# Patient Record
Sex: Male | Born: 1955 | Race: White | Hispanic: No | Marital: Single | Smoking: Never smoker
Health system: Southern US, Community
[De-identification: ages and names within clinical notes are randomized; demographics above are authoritative.]

## PROBLEM LIST (undated history)

## (undated) DIAGNOSIS — R079 Chest pain, unspecified: Secondary | ICD-10-CM

## (undated) DIAGNOSIS — I214 Non-ST elevation (NSTEMI) myocardial infarction: Secondary | ICD-10-CM

## (undated) DIAGNOSIS — Z951 Presence of aortocoronary bypass graft: Secondary | ICD-10-CM

## (undated) DIAGNOSIS — E669 Obesity, unspecified: Secondary | ICD-10-CM

## (undated) DIAGNOSIS — F419 Anxiety disorder, unspecified: Secondary | ICD-10-CM

## (undated) DIAGNOSIS — I2510 Atherosclerotic heart disease of native coronary artery without angina pectoris: Secondary | ICD-10-CM

## (undated) HISTORY — DX: Obesity, unspecified: E66.9

## (undated) HISTORY — DX: Atherosclerotic heart disease of native coronary artery without angina pectoris: I25.10

## (undated) HISTORY — DX: Presence of aortocoronary bypass graft: Z95.1

## (undated) HISTORY — DX: Non-ST elevation (NSTEMI) myocardial infarction: I21.4

## (undated) HISTORY — DX: Chest pain, unspecified: R07.9

---

## 2009-08-21 ENCOUNTER — Emergency Department (HOSPITAL_BASED_OUTPATIENT_CLINIC_OR_DEPARTMENT_OTHER): Admission: EM | Admit: 2009-08-21 | Discharge: 2009-08-22 | Payer: Self-pay | Admitting: Emergency Medicine

## 2009-08-31 ENCOUNTER — Encounter: Admission: RE | Admit: 2009-08-31 | Discharge: 2009-09-29 | Payer: Self-pay | Admitting: Orthopedic Surgery

## 2012-06-23 ENCOUNTER — Encounter (HOSPITAL_BASED_OUTPATIENT_CLINIC_OR_DEPARTMENT_OTHER): Payer: Self-pay | Admitting: *Deleted

## 2012-06-23 ENCOUNTER — Emergency Department (HOSPITAL_BASED_OUTPATIENT_CLINIC_OR_DEPARTMENT_OTHER)
Admission: EM | Admit: 2012-06-23 | Discharge: 2012-06-23 | Disposition: A | Discharge status: Home / Self Care | Payer: BC Managed Care – PPO | Attending: Emergency Medicine | Admitting: Emergency Medicine

## 2012-06-23 DIAGNOSIS — S51809A Unspecified open wound of unspecified forearm, initial encounter: Secondary | ICD-10-CM | POA: Insufficient documentation

## 2012-06-23 DIAGNOSIS — Y92009 Unspecified place in unspecified non-institutional (private) residence as the place of occurrence of the external cause: Secondary | ICD-10-CM | POA: Insufficient documentation

## 2012-06-23 DIAGNOSIS — Y93H2 Activity, gardening and landscaping: Secondary | ICD-10-CM | POA: Insufficient documentation

## 2012-06-23 DIAGNOSIS — IMO0002 Reserved for concepts with insufficient information to code with codable children: Secondary | ICD-10-CM

## 2012-06-23 DIAGNOSIS — W298XXA Contact with other powered powered hand tools and household machinery, initial encounter: Secondary | ICD-10-CM | POA: Insufficient documentation

## 2012-06-23 HISTORY — DX: Anxiety disorder, unspecified: F41.9

## 2012-06-23 MED ORDER — TETANUS-DIPHTH-ACELL PERTUSSIS 5-2.5-18.5 LF-MCG/0.5 IM SUSP: 0.5000 mL | Freq: Once | INTRAMUSCULAR | Status: DC

## 2012-06-23 NOTE — ED Provider Notes (Signed)
History     CSN: [id number]  Arrival date & time 06/23/12  1238   First MD Initiated Contact with Patient 06/23/12 1244      No chief complaint on file.   (Consider location/radiation/quality/duration/timing/severity/associated sxs/prior treatment) HPI Comments: 57 year old male presents emergency department after sustaining a laceration to his right forearm with a power operated saw while trimming his bushes in the yard prior to arrival. Patient states initially the wound was bleeding, however was able to control it. Denies pain initially or at this time. Last TDAP 2013. He does not take any blood thinners.  The history is provided by the patient.    No past medical history on file.  No past surgical history on file.  No family history on file.  History  Substance Use Topics  . Smoking status: Not on file  . Smokeless tobacco: Not on file  . Alcohol Use: Not on file      Review of Systems  Skin: Positive for wound.  Neurological: Negative for dizziness and light-headedness.  All other systems reviewed and are negative.    Allergies  Review of patient's allergies indicates not on file.  Home Medications  No current outpatient prescriptions on file.  There were no vitals taken for this visit.  Physical Exam  Nursing note and vitals reviewed. Constitutional: He is oriented to person, place, and time. He appears well-developed and well-nourished. No distress.  HENT:  Head: Normocephalic and atraumatic.  Eyes: Conjunctivae and EOM are normal.  Neck: Normal range of motion. Neck supple.  Cardiovascular: Normal rate, regular rhythm and normal heart sounds.   Pulmonary/Chest: Effort normal and breath sounds normal.  Musculoskeletal: Normal range of motion. He exhibits no edema.  Full range of motion of right arm. Strength 5 out of 5 upper extremity equal bilateral. Full use of forearm flexor and extensor muscles. No evidence of tendinous disruption.  Neurological:  He is alert and oriented to person, place, and time. He has normal strength. No sensory deficit.  Skin: Skin is warm and dry.     1.5 inch jagged laceration to right forearm. Wound partially through subcutaneous fat, not completely to muscle. Capillary refill less than 3 seconds.  Psychiatric: He has a normal mood and affect. His behavior is normal.    ED Course  Procedures (including critical care time) LACERATION REPAIR Performed by: Johnnette Gourd Authorized by: Johnnette Gourd Consent: Verbal consent obtained. Risks and benefits: risks, benefits and alternatives were discussed Consent given by: patient Patient identity confirmed: provided demographic data Prepped and Draped in normal sterile fashion Wound explored  Laceration Location: right forearm  Laceration Length: 3 cm  No Foreign Bodies seen or palpated  Anesthesia: local infiltration  Local anesthetic: lidocaine 2% without epinephrine  Anesthetic total: 4 ml  Irrigation method: syringe Amount of cleaning: standard  Skin closure: 4-0 ethilon  Number of sutures: 12  Technique: simple interrupted  Patient tolerance: Patient tolerated the procedure well with no immediate complications.  Labs Reviewed - No data to display No results found.   1. Laceration       MDM  57 year old male with laceration to right forearm. No evidence of tendinous disruption. Neurovascularly intact. Bleeding controlled. Wound clean and in sutured with 12 sutures placed. Last tetanus shot 2013. He is in no apparent distress. Wound care given. Patient stable for discharge. Return precautions discussed. Patient states understanding of plan and is agreeable.        Trevor Mace, PA-C 06/23/12 1346

## 2012-06-23 NOTE — ED Notes (Signed)
Patient states he was using a gas powdered hedge trimmer and accidentally cut his right forearm.  Neurovascular warm to touch, arm strength is normal, cap refill <3 seconds,  Bleeding controlled.

## 2012-06-25 NOTE — ED Provider Notes (Signed)
Medical screening examination/treatment/procedure(s) were performed by non-physician practitioner and as supervising physician I was immediately available for consultation/collaboration.  Harve Spradley T Maccoy Haubner, MD 06/25/12 0828 

## 2016-06-09 ENCOUNTER — Inpatient Hospital Stay (HOSPITAL_COMMUNITY)
Admission: AD | Admit: 2016-06-09 | Discharge: 2016-06-20 | DRG: 234 | Disposition: A | Discharge status: Home / Self Care | Payer: Non-veteran care | Source: Other Acute Inpatient Hospital | Attending: Cardiothoracic Surgery | Admitting: Cardiothoracic Surgery

## 2016-06-09 ENCOUNTER — Encounter (HOSPITAL_COMMUNITY): Payer: Self-pay

## 2016-06-09 ENCOUNTER — Inpatient Hospital Stay (HOSPITAL_COMMUNITY): Payer: Non-veteran care

## 2016-06-09 DIAGNOSIS — I2510 Atherosclerotic heart disease of native coronary artery without angina pectoris: Secondary | ICD-10-CM | POA: Diagnosis present

## 2016-06-09 DIAGNOSIS — R079 Chest pain, unspecified: Secondary | ICD-10-CM

## 2016-06-09 DIAGNOSIS — E785 Hyperlipidemia, unspecified: Secondary | ICD-10-CM | POA: Diagnosis present

## 2016-06-09 DIAGNOSIS — I252 Old myocardial infarction: Secondary | ICD-10-CM | POA: Diagnosis not present

## 2016-06-09 DIAGNOSIS — I25110 Atherosclerotic heart disease of native coronary artery with unstable angina pectoris: Secondary | ICD-10-CM | POA: Diagnosis not present

## 2016-06-09 DIAGNOSIS — I454 Nonspecific intraventricular block: Secondary | ICD-10-CM | POA: Diagnosis present

## 2016-06-09 DIAGNOSIS — E119 Type 2 diabetes mellitus without complications: Secondary | ICD-10-CM | POA: Diagnosis present

## 2016-06-09 DIAGNOSIS — D62 Acute posthemorrhagic anemia: Secondary | ICD-10-CM | POA: Diagnosis not present

## 2016-06-09 DIAGNOSIS — I200 Unstable angina: Secondary | ICD-10-CM | POA: Diagnosis not present

## 2016-06-09 DIAGNOSIS — Z7982 Long term (current) use of aspirin: Secondary | ICD-10-CM

## 2016-06-09 DIAGNOSIS — F419 Anxiety disorder, unspecified: Secondary | ICD-10-CM | POA: Diagnosis present

## 2016-06-09 DIAGNOSIS — I209 Angina pectoris, unspecified: Secondary | ICD-10-CM | POA: Diagnosis not present

## 2016-06-09 DIAGNOSIS — I5022 Chronic systolic (congestive) heart failure: Secondary | ICD-10-CM | POA: Diagnosis present

## 2016-06-09 DIAGNOSIS — I214 Non-ST elevation (NSTEMI) myocardial infarction: Principal | ICD-10-CM

## 2016-06-09 DIAGNOSIS — Z79899 Other long term (current) drug therapy: Secondary | ICD-10-CM | POA: Diagnosis not present

## 2016-06-09 DIAGNOSIS — Z6835 Body mass index (BMI) 35.0-35.9, adult: Secondary | ICD-10-CM | POA: Diagnosis not present

## 2016-06-09 DIAGNOSIS — Z951 Presence of aortocoronary bypass graft: Secondary | ICD-10-CM

## 2016-06-09 DIAGNOSIS — J9811 Atelectasis: Secondary | ICD-10-CM | POA: Diagnosis not present

## 2016-06-09 DIAGNOSIS — E669 Obesity, unspecified: Secondary | ICD-10-CM | POA: Diagnosis present

## 2016-06-09 DIAGNOSIS — Z09 Encounter for follow-up examination after completed treatment for conditions other than malignant neoplasm: Secondary | ICD-10-CM

## 2016-06-09 DIAGNOSIS — Z01818 Encounter for other preprocedural examination: Secondary | ICD-10-CM

## 2016-06-09 DIAGNOSIS — Z01810 Encounter for preprocedural cardiovascular examination: Secondary | ICD-10-CM | POA: Diagnosis not present

## 2016-06-09 DIAGNOSIS — Z9689 Presence of other specified functional implants: Secondary | ICD-10-CM

## 2016-06-09 HISTORY — DX: Obesity, unspecified: E66.9

## 2016-06-09 HISTORY — DX: Non-ST elevation (NSTEMI) myocardial infarction: I21.4

## 2016-06-09 HISTORY — DX: Chest pain, unspecified: R07.9

## 2016-06-09 LAB — ECHOCARDIOGRAM COMPLETE
AOASC: 31 cm
E decel time: 208 msec
EERAT: 12.44
FS: 34 % (ref 28–44)
HEIGHTINCHES: 75 in
IVS/LV PW RATIO, ED: 1
LA diam end sys: 40 mm
LA diam index: 1.56 cm/m2
LA vol A4C: 39.6 ml
LASIZE: 40 mm
LV e' LATERAL: 6.2 cm/s
LVEEAVG: 12.44
LVEEMED: 12.44
LVOT area: 4.15 cm2
LVOT diameter: 23 mm
Lateral S' vel: 20.2 cm/s
MV Dec: 208
MVAP: 3.61 cm2
MVPG: 2 mmHg
MVPKEVEL: 77.1 m/s
MVSPHT: 61 ms
PW: 12 mm — AB (ref 0.6–1.1)
RV TAPSE: 21.4 mm
TDI e' lateral: 6.2
TDI e' medial: 8.49
WEIGHTICAEL: 4638.48 [oz_av]

## 2016-06-09 LAB — LIPID PANEL
CHOL/HDL RATIO: 5.4 ratio
CHOLESTEROL: 222 mg/dL — AB (ref 0–200)
HDL: 41 mg/dL (ref >40)
LDL Cholesterol: 126 mg/dL — ABNORMAL HIGH (ref 0–99)
TRIGLYCERIDES: 273 mg/dL — AB (ref <150)
VLDL: 55 mg/dL — AB (ref 0–40)

## 2016-06-09 LAB — COMPREHENSIVE METABOLIC PANEL
ALBUMIN: 3.5 g/dL (ref 3.5–5.0)
ALT: 17 U/L (ref 17–63)
ANION GAP: 9 (ref 5–15)
AST: 32 U/L (ref 15–41)
Alkaline Phosphatase: 46 U/L (ref 38–126)
BUN: 17 mg/dL (ref 6–20)
CHLORIDE: 106 mmol/L (ref 101–111)
CO2: 24 mmol/L (ref 22–32)
Calcium: 8.6 mg/dL — ABNORMAL LOW (ref 8.9–10.3)
Creatinine, Ser: 0.95 mg/dL (ref 0.61–1.24)
GFR calc non Af Amer: >60 mL/min (ref >60)
GLUCOSE: 120 mg/dL — AB (ref 65–99)
Potassium: 4 mmol/L (ref 3.5–5.1)
SODIUM: 139 mmol/L (ref 135–145)
Total Bilirubin: 0.5 mg/dL (ref 0.3–1.2)
Total Protein: 6.5 g/dL (ref 6.5–8.1)

## 2016-06-09 LAB — APTT: aPTT: 65 seconds — ABNORMAL HIGH (ref 24–36)

## 2016-06-09 LAB — PROTIME-INR
INR: 1.04
Prothrombin Time: 13.7 seconds (ref 11.4–15.2)

## 2016-06-09 LAB — CBC
HCT: 39.7 % (ref 39.0–52.0)
Hemoglobin: 12.8 g/dL — ABNORMAL LOW (ref 13.0–17.0)
MCH: 30.3 pg (ref 26.0–34.0)
MCHC: 32.2 g/dL (ref 30.0–36.0)
MCV: 93.9 fL (ref 78.0–100.0)
PLATELETS: 196 10*3/uL (ref 150–400)
RBC: 4.23 MIL/uL (ref 4.22–5.81)
RDW: 13.6 % (ref 11.5–15.5)
WBC: 8.9 10*3/uL (ref 4.0–10.5)

## 2016-06-09 LAB — TROPONIN I
Troponin I: 2.65 ng/mL (ref <0.03)
Troponin I: 2.07 ng/mL (ref <0.03)
Troponin I: 3.03 ng/mL (ref <0.03)
Troponin I: 2.42 ng/mL (ref <0.03)

## 2016-06-09 LAB — BRAIN NATRIURETIC PEPTIDE: B Natriuretic Peptide: 81.3 pg/mL (ref 0.0–100.0)

## 2016-06-09 LAB — HEPARIN LEVEL (UNFRACTIONATED)
Heparin Unfractionated: 0.33 IU/mL (ref 0.30–0.70)
Heparin Unfractionated: 0.38 IU/mL (ref 0.30–0.70)

## 2016-06-09 MED ORDER — TICAGRELOR 90 MG PO TABS
180.0000 mg | ORAL_TABLET | Freq: Once | ORAL | Status: AC
  Administered 2016-06-09: 180 mg via ORAL
  Filled 2016-06-09: qty 2

## 2016-06-09 MED ORDER — ACETAMINOPHEN 325 MG PO TABS
650.0000 mg | ORAL_TABLET | ORAL | Status: DC | PRN
  Administered 2016-06-13: 650 mg via ORAL
  Filled 2016-06-09: qty 2

## 2016-06-09 MED ORDER — ASPIRIN EC 81 MG PO TBEC
81.0000 mg | DELAYED_RELEASE_TABLET | Freq: Every day | ORAL | Status: DC
  Administered 2016-06-09 – 2016-06-14 (×5): 81 mg via ORAL
  Filled 2016-06-09 (×5): qty 1

## 2016-06-09 MED ORDER — PERFLUTREN LIPID MICROSPHERE
INTRAVENOUS | Status: AC
  Filled 2016-06-09: qty 10

## 2016-06-09 MED ORDER — SODIUM CHLORIDE 0.9 % IV SOLN
INTRAVENOUS | Status: DC
  Administered 2016-06-09: 75 mL/h via INTRAVENOUS
  Administered 2016-06-10 – 2016-06-11 (×2): via INTRAVENOUS

## 2016-06-09 MED ORDER — ATORVASTATIN CALCIUM 80 MG PO TABS
80.0000 mg | ORAL_TABLET | Freq: Every day | ORAL | Status: DC
  Administered 2016-06-09 – 2016-06-19 (×10): 80 mg via ORAL
  Filled 2016-06-09 (×11): qty 1

## 2016-06-09 MED ORDER — TICAGRELOR 90 MG PO TABS
90.0000 mg | ORAL_TABLET | Freq: Two times a day (BID) | ORAL | Status: DC
  Administered 2016-06-09 – 2016-06-11 (×4): 90 mg via ORAL
  Filled 2016-06-09 (×4): qty 1

## 2016-06-09 MED ORDER — HEPARIN (PORCINE) IN NACL 100-0.45 UNIT/ML-% IJ SOLN
1750.0000 [IU]/h | INTRAMUSCULAR | Status: DC
Start: 2016-06-09 — End: 2016-06-11
  Administered 2016-06-10 (×2): 1750 [IU]/h via INTRAVENOUS
  Filled 2016-06-09 (×3): qty 250

## 2016-06-09 MED ORDER — ONDANSETRON HCL 4 MG/2ML IJ SOLN: 4.0000 mg | Freq: Four times a day (QID) | INTRAMUSCULAR | Status: DC | PRN

## 2016-06-09 MED ORDER — METOPROLOL TARTRATE 12.5 MG HALF TABLET
12.5000 mg | ORAL_TABLET | Freq: Two times a day (BID) | ORAL | Status: DC
  Administered 2016-06-09 – 2016-06-10 (×4): 12.5 mg via ORAL
  Filled 2016-06-09 (×4): qty 1

## 2016-06-09 MED ORDER — NITROGLYCERIN IN D5W 200-5 MCG/ML-% IV SOLN
0.0000 ug/min | INTRAVENOUS | Status: DC
  Filled 2016-06-09: qty 250

## 2016-06-09 NOTE — Plan of Care (Signed)
Problem: Pain Managment: Goal: General experience of comfort will improve Outcome: Completed/Met Date Met: 06/09/16 Patient has had no complaints of pain, we sat up and moved around in bed, pain free.

## 2016-06-09 NOTE — Progress Notes (Signed)
ANTICOAGULATION CONSULT NOTE - Initial Consult  Pharmacy Consult for Heparin Indication: chest pain/ACS  No Known Allergies  Patient Measurements: Height:  (190.5 cm) Weight: 289 lb 14.5 oz (131.5 kg) IBW/kg (Calculated) : 84.5 Heparin Dosing Weight: 110 kg  Vital Signs: Temp: 97.6 F (36.4 C) (03/31 0229) Temp Source: Oral (03/31 0229) BP: 103/68 (03/31 0229) Pulse Rate: 97 (03/31 0229)  Labs:  Recent Labs  06/09/16 0246 06/09/16 0259  HGB 12.8*  --   HCT 39.7  --   PLT 196  --   APTT 65*  --   LABPROT 13.7  --   INR 1.04  --   HEPARINUNFRC  --  0.33  CREATININE 0.95  --   TROPONINI 2.65*  --     Estimated Creatinine Clearance: 117.8 mL/min (by C-G formula based on SCr of 0.95 mg/dL).   Medical History: Past Medical History:  Diagnosis Date  . Anxiety     Medications:  Flomax  OxyIR  Zofran  Assessment: 61 y.o. male with chest pain/NSTEMI for heparin.  Heparin 5000 units IV bolus, 1600 units/hr started at [facility] at 11 pm.  Initial heparin level 0.33   Goal of Therapy:  Heparin level 0.3-0.7 units/ml Monitor platelets by anticoagulation protocol: Yes   Plan:  Expect initial heparin level to decrease since drawn just 4 hours after bolus, so will increase heparin 1750  units/hr Recheck level in 6 hours  Omarr Hann, Gary Fleet 06/09/2016,3:51 AM

## 2016-06-09 NOTE — Progress Notes (Signed)
CRITICAL VALUE ALERT  Critical value received:  Troponin 2.65   Date of notification:  06/09/2016  Time of notification:  0338  Critical value read back: Yes  Nurse who received alert:  Clydene Laming, RN  MD notified (1st page):  Dr. Charlestine Night (Cards Fellow)  Time of first page:  [phone]   Responding MD:    Time MD responded:

## 2016-06-09 NOTE — Progress Notes (Signed)
   Progress Note  Patient Name: [name] Date of Encounter: 06/09/2016  Primary Cardiologist: New/Markeita Alicia  Subjective   No chest pain this am   Inpatient Medications    Scheduled Meds: . aspirin EC  81 mg Oral Daily  . atorvastatin  80 mg Oral q1800  . metoprolol tartrate  12.5 mg Oral BID   Continuous Infusions: . heparin 1,750 Units/hr (06/09/16 0428)  . nitroGLYCERIN     PRN Meds: acetaminophen, ondansetron (ZOFRAN) IV   Vital Signs    Vitals:   06/09/16 0229 06/09/16 0745  BP: 103/68 116/77  Pulse: 97   Resp: 18   Temp: 97.6 F (36.4 C)   TempSrc: Oral   SpO2: 97%   Weight: 289 lb 14.5 oz (131.5 kg)   Height:  (1.905 m)     Intake/Output Summary (Last 24 hours) at 06/09/16 0824 Last data filed at 06/09/16 0429  Gross per 24 hour  Intake           120.29 ml  Output                0 ml  Net           120.29 ml   Filed Weights   06/09/16 0229  Weight: 289 lb 14.5 oz (131.5 kg)    Telemetry    NSR no arrhythmia 06/09/2016  - Personally Reviewed  ECG    SR lateral J point elevation I,AVL poor R wave progression  - Personally Reviewed  Physical Exam  Obese white male  GEN: No acute distress.   Neck: No JVD Cardiac: RRR, no murmurs, rubs, or gallops.  Respiratory: Clear to auscultation bilaterally. GI: Soft, nontender, non-distended  MS: No edema; No deformity. Neuro:  Nonfocal  Psych: Normal affect   Labs    Chemistry Recent Labs Lab 06/09/16 0246  NA 139  K 4.0  CL 106  CO2 24  GLUCOSE 120*  BUN 17  CREATININE 0.95  CALCIUM 8.6*  PROT 6.5  ALBUMIN 3.5  AST 32  ALT 17  ALKPHOS 46  BILITOT 0.5  GFRNONAA >60  GFRAA >60  ANIONGAP 9     Hematology Recent Labs Lab 06/09/16 0246  WBC 8.9  RBC 4.23  HGB 12.8*  HCT 39.7  MCV 93.9  MCH 30.3  MCHC 32.2  RDW 13.6  PLT 196    Cardiac Enzymes Recent Labs Lab 06/09/16 0246  TROPONINI 2.65*   No results for input(s): TROPIPOC in the last 168 hours.    BNP Recent Labs Lab 06/09/16 0246  BNP 81.3     DDimer No results for input(s): DDIMER in the last 168 hours.   Radiology    No results found.  Cardiac Studies   Echo Pending   Patient Profile     61 y.o. male SSCP started Thursday Transferred from [place] for positive troponin And cath on Monday   Assessment & Plan    1) SEMI:  Pain free on heparin BP soft will hydrate ? Circumflex MI with lateral J point Elevation. Trend enzymes follow ECG Hydrate for BP Loaded with Brillinta by fellow Will continue although hope patient does not have surgical disease. Will allow to eat Since pain free this am Continue ASA/beta blocker On high dose statin   Signed, Charlton Haws, MD  06/09/2016, 8:24 AM

## 2016-06-09 NOTE — Progress Notes (Signed)
ANTICOAGULATION CONSULT NOTE - Follow-Up Consult  Pharmacy Consult for Heparin Indication: chest pain/ACS  No Known Allergies  Patient Measurements: Height:  (190.5 cm) Weight: 289 lb 14.5 oz (131.5 kg) IBW/kg (Calculated) : 84.5 Heparin Dosing Weight: 110 kg  Vital Signs: Temp: 97.3 F (36.3 C) (03/31 0845) Temp Source: Oral (03/31 0845) BP: 112/66 (03/31 0909) Pulse Rate: 71 (03/31 0909)  Labs:  Recent Labs  06/09/16 0246 06/09/16 0259 06/09/16 1005  HGB 12.8*  --   --   HCT 39.7  --   --   PLT 196  --   --   APTT 65*  --   --   LABPROT 13.7  --   --   INR 1.04  --   --   HEPARINUNFRC  --  0.33 0.38  CREATININE 0.95  --   --   TROPONINI 2.65*  --  2.07*    Estimated Creatinine Clearance: 117.8 mL/min (by C-G formula based on SCr of 0.95 mg/dL).   Medical History: Past Medical History:  Diagnosis Date  . Anxiety     Assessment: 61 y.o. male with chest pain/NSTEMI to start on heparin drip in anticipation of cardiac cath on Monday. Troponins elevated, CBC wnl, no OAC PTA. Heparin level now therapeutic x2.    Goal of Therapy:  Heparin level 0.3-0.7 units/ml Monitor platelets by anticoagulation protocol: Yes   Plan:  -Continue heparin 1750 units/hr -Daily heparin level, CBC, S/Sx bleeding  Fredonia Highland, PharmD PGY-1 Pharmacy Resident Pager: [phone] 06/09/2016

## 2016-06-09 NOTE — H&P (Signed)
History and Physical  Primary Cardiologist:N/A PCP: Robyne Peers., MD  [facility])  Chief Complaint: NSTEMI  HPI:  Mr. Sadlon is a 61 year old male without known CAD presenting as a transfer from [facility] emergency room with chest pain, found to have NSTEMI.  I was called by Dr. Geraldo Pitter with the transfer, per patient preference.  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  He was still able to work, thinking he had pulled a muscle.  Today, it moved into the chest, and became severely uncomfortable at 8 PM prompting presentation to [facility] ED.  Troponin-I assay was elevated to 1.21, then 1.59 and he was treated with 325 ASA, UF Heparin, and Nitroglycerine gtt, metoprolol, and atorvastatin.  Patient became pain free in [place] ED, and subsequently has been chest pain free.    Sees his PCP through the [place] system but denies chronic medical conditions or medications.  Adopted, no known family history.  No tobacco etoh use.  No allergies.  On ROS, no other SOB, DOE, edema, bleeding/bruising problems.  Prior Studies N/A  Past Medical History:  Diagnosis Date  . Anxiety     History reviewed. No pertinent surgical history.  History reviewed. No pertinent family history. (UNK due to being adopted) Social History:  reports that he has never smoked. He has never used smokeless tobacco. He reports that he does not drink alcohol or use drugs.  Allergies: No Known Allergies  No current facility-administered medications on file prior to encounter.    No current outpatient prescriptions on file prior to encounter.   Results for orders placed or performed during the hospital encounter of 06/09/16 (from the past 48 hour(s))  CBC     Status: Abnormal   Collection Time: 06/09/16  2:46 AM  Result Value Ref Range   WBC 8.9 4.0 - 10.5 K/uL   RBC 4.23 4.22 - 5.81 MIL/uL   Hemoglobin 12.8 (L) 13.0 - 17.0 g/dL   HCT 39.7 39.0 - 52.0 %   MCV  93.9 78.0 - 100.0 fL   MCH 30.3 26.0 - 34.0 pg   MCHC 32.2 30.0 - 36.0 g/dL   RDW 13.6 11.5 - 15.5 %   Platelets 196 150 - 400 K/uL  Comprehensive metabolic panel     Status: Abnormal   Collection Time: 06/09/16  2:46 AM  Result Value Ref Range   Sodium 139 135 - 145 mmol/L   Potassium 4.0 3.5 - 5.1 mmol/L   Chloride 106 101 - 111 mmol/L   CO2 24 22 - 32 mmol/L   Glucose, Bld 120 (H) 65 - 99 mg/dL   BUN 17 6 - 20 mg/dL   Creatinine, Ser 0.95 0.61 - 1.24 mg/dL   Calcium 8.6 (L) 8.9 - 10.3 mg/dL   Total Protein 6.5 6.5 - 8.1 g/dL   Albumin 3.5 3.5 - 5.0 g/dL   AST 32 15 - 41 U/L   ALT 17 17 - 63 U/L   Alkaline Phosphatase 46 38 - 126 U/L   Total Bilirubin 0.5 0.3 - 1.2 mg/dL   GFR calc non Af Amer >60 >60 mL/min   GFR calc Af Amer >60 >60 mL/min    Comment: (NOTE) The eGFR has been calculated using the CKD EPI equation. This calculation has not been validated in all clinical situations. eGFR's persistently <60 mL/min signify possible Chronic Kidney Disease.    Anion gap 9 5 - 15  Brain natriuretic peptide  Status: None   Collection Time: 06/09/16  2:46 AM  Result Value Ref Range   B Natriuretic Peptide 81.3 0.0 - 100.0 pg/mL  Lipid panel     Status: Abnormal   Collection Time: 06/09/16  2:46 AM  Result Value Ref Range   Cholesterol 222 (H) 0 - 200 mg/dL   Triglycerides 273 (H) <150 mg/dL   HDL 41 >40 mg/dL   Total CHOL/HDL Ratio 5.4 RATIO   VLDL 55 (H) 0 - 40 mg/dL   LDL Cholesterol 126 (H) 0 - 99 mg/dL    Comment:        Total Cholesterol/HDL:CHD Risk Coronary Heart Disease Risk Table                     Men   Women  1/2 Average Risk   3.4   3.3  Average Risk       5.0   4.4  2 X Average Risk   9.6   7.1  3 X Average Risk  23.4   11.0        Use the calculated Patient Ratio above and the CHD Risk Table to determine the patient's CHD Risk.        ATP III CLASSIFICATION (LDL):  <100     mg/dL   Optimal  100-129  mg/dL   Near or Above                     Optimal  130-159  mg/dL   Borderline  160-189  mg/dL   High  >190     mg/dL   Very High   Protime-INR     Status: None   Collection Time: 06/09/16  2:46 AM  Result Value Ref Range   Prothrombin Time 13.7 11.4 - 15.2 seconds   INR 1.04   APTT     Status: Abnormal   Collection Time: 06/09/16  2:46 AM  Result Value Ref Range   aPTT 65 (H) 24 - 36 seconds    Comment:        IF BASELINE aPTT IS ELEVATED, SUGGEST PATIENT RISK ASSESSMENT BE USED TO DETERMINE APPROPRIATE ANTICOAGULANT THERAPY.   Troponin I (q 6hr x 3)     Status: Abnormal   Collection Time: 06/09/16  2:46 AM  Result Value Ref Range   Troponin I 2.65 (HH) <0.03 ng/mL    Comment: CRITICAL RESULT CALLED TO, READ BACK BY AND VERIFIED WITH: NEWELL M,RN 06/09/16 0338 WAYK    No results found.  ECG/Tele: NST rate 68 with non-specific ST-T wave changes laterally  ROS: As above. Otherwise, review of systems is negative unless per above HPI  Vitals:   06/09/16 0229  BP: 103/68  Pulse: 97  Resp: 18  Temp: 97.6 F (36.4 C)  TempSrc: Oral  SpO2: 97%  Weight: 131.5 kg (289 lb 14.5 oz)  Height: 6' 3"  (1.905 m)   Wt Readings from Last 10 Encounters:  06/09/16 131.5 kg (289 lb 14.5 oz)  06/23/12 115.7 kg (255 lb)    PE:  General: No acute distress HEENT: Atraumatic, EOMI, mucous membranes moist CV: RRR no murmurs, gallops. No JVD at 45 degrees. No HJR. Respiratory: Clear, no crackles. Normal work of breathing ABD: Obese, non-distended and non-tender. No palpable organomegaly.  Extremities: 2+ radial pulses bilaterally. No edema. Neuro/Psych: CN grossly intact, alert and oriented  Assessment/Plan Mr. Dissinger is a 61 year old male without known history presenting with NSTEMI.  Currently chest  pain free.  NSTEMI - S/P 325.  Will load with second P2Y12 agent since Friday night, patient stable, and likely cath Monday.  Brillinta 180 mg po x 1.   - IV heparin per pharmacy - On NTG IV gtt, would wean off if continues  to be stable - Metoprolol 12.5 mg BID - Atorvastatin 80 mg (LDL ~120s) - Likely catheterization Monday, or sooner should symptoms change.  NPO except clears as a precaution.   HLD - Atorvastatin as above  Lolita Cram Means, MD 06/09/2016, 3:40 AM

## 2016-06-10 LAB — HEMOGLOBIN A1C
Hgb A1c MFr Bld: 6.2 % — ABNORMAL HIGH (ref 4.8–5.6)
Mean Plasma Glucose: 131 mg/dL

## 2016-06-10 LAB — CBC
HEMATOCRIT: 37.9 % — AB (ref 39.0–52.0)
HEMOGLOBIN: 12 g/dL — AB (ref 13.0–17.0)
MCH: 29.7 pg (ref 26.0–34.0)
MCHC: 31.7 g/dL (ref 30.0–36.0)
MCV: 93.8 fL (ref 78.0–100.0)
Platelets: 176 10*3/uL (ref 150–400)
RBC: 4.04 MIL/uL — AB (ref 4.22–5.81)
RDW: 14 % (ref 11.5–15.5)
WBC: 7.1 10*3/uL (ref 4.0–10.5)

## 2016-06-10 LAB — HIV ANTIBODY (ROUTINE TESTING W REFLEX): HIV Screen 4th Generation wRfx: NONREACTIVE

## 2016-06-10 LAB — HEPARIN LEVEL (UNFRACTIONATED): Heparin Unfractionated: 0.5 IU/mL (ref 0.30–0.70)

## 2016-06-10 MED ORDER — SODIUM CHLORIDE 0.9 % WEIGHT BASED INFUSION: 3.0000 mL/kg/h | INTRAVENOUS | Status: DC

## 2016-06-10 MED ORDER — SODIUM CHLORIDE 0.9 % WEIGHT BASED INFUSION
1.0000 mL/kg/h | INTRAVENOUS | Status: DC
  Administered 2016-06-11: 1 mL/kg/h via INTRAVENOUS

## 2016-06-10 MED ORDER — ASPIRIN 81 MG PO CHEW
81.0000 mg | CHEWABLE_TABLET | ORAL | Status: AC
  Administered 2016-06-11: 81 mg via ORAL
  Filled 2016-06-10: qty 1

## 2016-06-10 MED ORDER — SODIUM CHLORIDE 0.9% FLUSH
3.0000 mL | Freq: Two times a day (BID) | INTRAVENOUS | Status: DC
  Administered 2016-06-10: 3 mL via INTRAVENOUS

## 2016-06-10 MED ORDER — SODIUM CHLORIDE 0.9 % IV SOLN: 250.0000 mL | INTRAVENOUS | Status: DC | PRN

## 2016-06-10 MED ORDER — SODIUM CHLORIDE 0.9% FLUSH: 3.0000 mL | INTRAVENOUS | Status: DC | PRN

## 2016-06-10 NOTE — Progress Notes (Signed)
 Progress Note  Patient Name: [name] Date of Encounter: 06/10/2016  Primary Cardiologist: New/Nishan  Subjective   No chest pain this am Wife has had stent with Dr Kelly and sees [facility] in [place]  Inpatient Medications    Scheduled Meds: . aspirin EC  81 mg Oral Daily  . atorvastatin  80 mg Oral q1800  . metoprolol tartrate  12.5 mg Oral BID  . ticagrelor  90 mg Oral BID   Continuous Infusions: . sodium chloride 75 mL/hr at 06/09/16 1800  . heparin 1,750 Units/hr (06/10/16 0315)  . nitroGLYCERIN 10 mcg/min (06/10/16 0437)   PRN Meds: acetaminophen, ondansetron (ZOFRAN) IV   Vital Signs    Vitals:   06/10/16 0205 06/10/16 0316 06/10/16 0432 06/10/16 0740  BP: 118/73  136/88   Pulse: 70  73 73  Resp: 20     Temp: 98.2 F (36.8 C)  97.9 F (36.6 C) 97.7 F (36.5 C)  TempSrc: Oral  Oral Oral  SpO2: 97%  98%   Weight:  288 lb 1.6 oz (130.7 kg)    Height:        Intake/Output Summary (Last 24 hours) at 06/10/16 0921 Last data filed at 06/10/16 0700  Gross per 24 hour  Intake              480 ml  Output             2675 ml  Net            -2195 ml   Filed Weights   06/09/16 0229 06/10/16 0316  Weight: 289 lb 14.5 oz (131.5 kg) 288 lb 1.6 oz (130.7 kg)    Telemetry    NSR no arrhythmia 06/10/2016  - Personally Reviewed  ECG    SR lateral J point elevation I,AVL poor R wave progression  - Personally Reviewed  Physical Exam  Obese white male  GEN: No acute distress.   Neck: No JVD Cardiac: RRR, no murmurs, rubs, or gallops.  Respiratory: Clear to auscultation bilaterally. GI: Soft, nontender, non-distended  MS: No edema; No deformity. Neuro:  Nonfocal  Psych: Normal affect   Labs    Chemistry  Recent Labs Lab 06/09/16 0246  NA 139  K 4.0  CL 106  CO2 24  GLUCOSE 120*  BUN 17  CREATININE 0.95  CALCIUM 8.6*  PROT 6.5  ALBUMIN 3.5  AST 32  ALT 17  ALKPHOS 46  BILITOT 0.5  GFRNONAA >60  GFRAA >60  ANIONGAP 9      Hematology  Recent Labs Lab 06/09/16 0246 06/10/16 0355  WBC 8.9 7.1  RBC 4.23 4.04*  HGB 12.8* 12.0*  HCT 39.7 37.9*  MCV 93.9 93.8  MCH 30.3 29.7  MCHC 32.2 31.7  RDW 13.6 14.0  PLT 196 176    Cardiac Enzymes  Recent Labs Lab 06/09/16 0246 06/09/16 1005 06/09/16 1615 06/09/16 2157  TROPONINI 2.65* 2.07* 3.03* 2.42*   No results for input(s): TROPIPOC in the last 168 hours.   BNP  Recent Labs Lab 06/09/16 0246  BNP 81.3     DDimer No results for input(s): DDIMER in the last 168 hours.   Radiology    No results found.  Cardiac Studies   Echo Pending   Patient Profile     61 y.o. male SSCP started Thursday Transferred from [place] for positive troponin And cath on Monday   Assessment & Plan    1) SEMI:  Pain free on heparin BP   better with heparin  Circumflex MI with lateral J point Elevation. ECG this am normal with insignificant q waves in 3,F Loaded with Brillinta by fellow  Troponin coming down already  Will continue although hope patient does not have surgical disease.Continue ASA/beta blocker On high dose statin  Cath in am    Signed, Charlton Haws, MD  06/10/2016, 9:21 AM

## 2016-06-10 NOTE — Progress Notes (Signed)
ANTICOAGULATION CONSULT NOTE - Follow-Up Consult  Pharmacy Consult for Heparin Indication: chest pain/ACS  No Known Allergies  Patient Measurements: Height:  (190.5 cm) Weight: 288 lb 1.6 oz (130.7 kg) IBW/kg (Calculated) : 84.5 Heparin Dosing Weight: 110 kg  Vital Signs: Temp: 97.7 F (36.5 C) (04/01 0740) Temp Source: Oral (04/01 0740) BP: 136/88 (04/01 0432) Pulse Rate: 73 (04/01 0740)  Labs:  Recent Labs  06/09/16 0246 06/09/16 0259 06/09/16 1005 06/09/16 1615 06/09/16 2157 06/10/16 0355  HGB 12.8*  --   --   --   --  12.0*  HCT 39.7  --   --   --   --  37.9*  PLT 196  --   --   --   --  176  APTT 65*  --   --   --   --   --   LABPROT 13.7  --   --   --   --   --   INR 1.04  --   --   --   --   --   HEPARINUNFRC  --  0.33 0.38  --   --  0.50  CREATININE 0.95  --   --   --   --   --   TROPONINI 2.65*  --  2.07* 3.03* 2.42*  --     Estimated Creatinine Clearance: 117.5 mL/min (by C-G formula based on SCr of 0.95 mg/dL).   Medical History: Past Medical History:  Diagnosis Date  . Anxiety     Assessment: 61 y.o. male with chest pain/NSTEMI to start on heparin drip in anticipation of cardiac cath on Monday. Troponins elevated, CBC stable, no OAC PTA. Heparin remains therapeutic at 0.50.   Goal of Therapy:  Heparin level 0.3-0.7 units/ml Monitor platelets by anticoagulation protocol: Yes   Plan:  -Continue heparin 1750 units/hr -Daily heparin level, CBC, S/Sx bleeding  Fredonia Highland, PharmD PGY-1 Pharmacy Resident Pager: [phone] 06/10/2016

## 2016-06-11 ENCOUNTER — Other Ambulatory Visit: Payer: Self-pay | Admitting: *Deleted

## 2016-06-11 ENCOUNTER — Encounter (HOSPITAL_COMMUNITY)
Admission: AD | Disposition: A | Discharge status: Home / Self Care | Payer: Self-pay | Source: Other Acute Inpatient Hospital | Attending: Cardiology

## 2016-06-11 ENCOUNTER — Encounter (HOSPITAL_COMMUNITY): Payer: Self-pay | Admitting: Internal Medicine

## 2016-06-11 DIAGNOSIS — I214 Non-ST elevation (NSTEMI) myocardial infarction: Secondary | ICD-10-CM

## 2016-06-11 DIAGNOSIS — I2510 Atherosclerotic heart disease of native coronary artery without angina pectoris: Secondary | ICD-10-CM

## 2016-06-11 DIAGNOSIS — I25110 Atherosclerotic heart disease of native coronary artery with unstable angina pectoris: Secondary | ICD-10-CM

## 2016-06-11 HISTORY — PX: LEFT HEART CATH AND CORONARY ANGIOGRAPHY: CATH118249

## 2016-06-11 LAB — CBC
HCT: 38.4 % — ABNORMAL LOW (ref 39.0–52.0)
Hemoglobin: 12.3 g/dL — ABNORMAL LOW (ref 13.0–17.0)
MCH: 29.8 pg (ref 26.0–34.0)
MCHC: 32 g/dL (ref 30.0–36.0)
MCV: 93 fL (ref 78.0–100.0)
PLATELETS: 184 10*3/uL (ref 150–400)
RBC: 4.13 MIL/uL — AB (ref 4.22–5.81)
RDW: 14 % (ref 11.5–15.5)
WBC: 8.7 10*3/uL (ref 4.0–10.5)

## 2016-06-11 LAB — TSH: TSH: 1.764 u[IU]/mL (ref 0.350–4.500)

## 2016-06-11 LAB — HEPARIN LEVEL (UNFRACTIONATED): Heparin Unfractionated: 0.45 IU/mL (ref 0.30–0.70)

## 2016-06-11 SURGERY — LEFT HEART CATH AND CORONARY ANGIOGRAPHY
Anesthesia: LOCAL

## 2016-06-11 MED ORDER — HEPARIN (PORCINE) IN NACL 2-0.9 UNIT/ML-% IJ SOLN
INTRAMUSCULAR | Status: AC
  Filled 2016-06-11: qty 1000

## 2016-06-11 MED ORDER — HEPARIN (PORCINE) IN NACL 2-0.9 UNIT/ML-% IJ SOLN
INTRAMUSCULAR | Status: DC | PRN
  Administered 2016-06-11: 10 mL via INTRA_ARTERIAL

## 2016-06-11 MED ORDER — LIDOCAINE HCL (PF) 1 % IJ SOLN
INTRAMUSCULAR | Status: DC | PRN
  Administered 2016-06-11: 2 mL via INTRADERMAL

## 2016-06-11 MED ORDER — HEPARIN SODIUM (PORCINE) 1000 UNIT/ML IJ SOLN
INTRAMUSCULAR | Status: DC | PRN
  Administered 2016-06-11: 5000 [IU] via INTRAVENOUS

## 2016-06-11 MED ORDER — HEPARIN SODIUM (PORCINE) 1000 UNIT/ML IJ SOLN
INTRAMUSCULAR | Status: AC
  Filled 2016-06-11: qty 1

## 2016-06-11 MED ORDER — LIDOCAINE HCL (PF) 1 % IJ SOLN
INTRAMUSCULAR | Status: AC
  Filled 2016-06-11: qty 30

## 2016-06-11 MED ORDER — FENTANYL CITRATE (PF) 100 MCG/2ML IJ SOLN
INTRAMUSCULAR | Status: DC | PRN
  Administered 2016-06-11: 50 ug via INTRAVENOUS

## 2016-06-11 MED ORDER — IOPAMIDOL (ISOVUE-370) INJECTION 76%
INTRAVENOUS | Status: AC
  Filled 2016-06-11: qty 100

## 2016-06-11 MED ORDER — MIDAZOLAM HCL 2 MG/2ML IJ SOLN
INTRAMUSCULAR | Status: DC | PRN
  Administered 2016-06-11: 1 mg via INTRAVENOUS

## 2016-06-11 MED ORDER — FUROSEMIDE 10 MG/ML IJ SOLN
20.0000 mg | Freq: Every day | INTRAMUSCULAR | Status: DC
  Administered 2016-06-11 – 2016-06-13 (×3): 20 mg via INTRAVENOUS
  Filled 2016-06-11 (×3): qty 2

## 2016-06-11 MED ORDER — SODIUM CHLORIDE 0.9% FLUSH: 3.0000 mL | INTRAVENOUS | Status: DC | PRN

## 2016-06-11 MED ORDER — HEPARIN (PORCINE) IN NACL 100-0.45 UNIT/ML-% IJ SOLN
1750.0000 [IU]/h | INTRAMUSCULAR | Status: DC
  Administered 2016-06-11 – 2016-06-14 (×4): 1750 [IU]/h via INTRAVENOUS
  Filled 2016-06-11 (×6): qty 250

## 2016-06-11 MED ORDER — IOPAMIDOL (ISOVUE-370) INJECTION 76%
INTRAVENOUS | Status: DC | PRN
  Administered 2016-06-11: 90 mL via INTRAVENOUS

## 2016-06-11 MED ORDER — SODIUM CHLORIDE 0.9% FLUSH
3.0000 mL | Freq: Two times a day (BID) | INTRAVENOUS | Status: DC
  Administered 2016-06-12 – 2016-06-13 (×3): 3 mL via INTRAVENOUS

## 2016-06-11 MED ORDER — SODIUM CHLORIDE 0.9 % IV SOLN: 250.0000 mL | INTRAVENOUS | Status: DC | PRN

## 2016-06-11 MED ORDER — FENTANYL CITRATE (PF) 100 MCG/2ML IJ SOLN
INTRAMUSCULAR | Status: AC
  Filled 2016-06-11: qty 2

## 2016-06-11 MED ORDER — HEPARIN (PORCINE) IN NACL 2-0.9 UNIT/ML-% IJ SOLN
INTRAMUSCULAR | Status: DC | PRN
  Administered 2016-06-11: 2000 mL

## 2016-06-11 MED ORDER — MIDAZOLAM HCL 2 MG/2ML IJ SOLN
INTRAMUSCULAR | Status: AC
Start: 2016-06-11 — End: 2016-06-11
  Filled 2016-06-11: qty 2

## 2016-06-11 MED ORDER — METOPROLOL TARTRATE 25 MG PO TABS
25.0000 mg | ORAL_TABLET | Freq: Two times a day (BID) | ORAL | Status: DC
  Administered 2016-06-11 – 2016-06-14 (×7): 25 mg via ORAL
  Filled 2016-06-11 (×7): qty 1

## 2016-06-11 MED ORDER — VERAPAMIL HCL 2.5 MG/ML IV SOLN
INTRAVENOUS | Status: AC
  Filled 2016-06-11: qty 2

## 2016-06-11 SURGICAL SUPPLY — 15 items
CATH 5FR JL3.5 JR4 ANG PIG MP (CATHETERS) ×2 IMPLANT
CATH INFINITI 5 FR AL2 (CATHETERS) ×2 IMPLANT
DEVICE RAD COMP TR BAND LRG (VASCULAR PRODUCTS) ×2 IMPLANT
GLIDESHEATH SLEND SS 6F .021 (SHEATH) ×2 IMPLANT
GUIDEWIRE INQWIRE 1.5J.035X260 (WIRE) ×1 IMPLANT
INQWIRE 1.5J .035X260CM (WIRE) ×2
KIT HEART LEFT (KITS) ×2 IMPLANT
PACK CARDIAC CATHETERIZATION (CUSTOM PROCEDURE TRAY) ×2 IMPLANT
PROTECTION STATION PRESSURIZED (MISCELLANEOUS) ×2
STATION PROTECTION PRESSURIZED (MISCELLANEOUS) ×1 IMPLANT
SYR MEDRAD MARK V 150ML (SYRINGE) ×2 IMPLANT
TRANSDUCER W/STOPCOCK (MISCELLANEOUS) ×2 IMPLANT
TUBING CIL FLEX 10 FLL-RA (TUBING) ×2 IMPLANT
WIRE HI TORQ BMW 190CM (WIRE) ×2 IMPLANT
WIRE HI TORQ VERSACORE-J 145CM (WIRE) ×2 IMPLANT

## 2016-06-11 NOTE — Progress Notes (Signed)
Patient ID: [name], male   DOB: 07-25-1955, 61 y.o.   MRN: [id number]      [address]       [place] [address]             [phone]        [name] [facility] Medical Record #[id number] Date of Birth: 03/22/1955  Referring: Dr End Primary Care: [name]., MD  Chief Complaint:  Right arm pain, chest pain  History of Present Illness:     [name] is a 61 year old male with a past medical history of obesity and anxiety who presented to [facility] on Saturday, March 31 with a few days of a right sided arm pain which was accompanied by chest pain. 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 He noticed the right arm pain initially and thought that he pulled a muscle. Then eventually the arm pain escalated and he had associated chest pain. He went to a hospital in [place] . He was then transferred to [facility]. He received a catheterization today 06/11/2016 which showed multivessel coronary artery disease including 80% ostial and 90% mid LAD lesions. There was also a 60% moderate caliber OM1 as well as 60-70% proximal on 40% mid RCA lesions present. He received an echocardiogram which showed decreased left ventricular ejection fraction of 45-50%. There was no evidence of valvular disease. The patient is currently chest pain-free however, he is on both nitroglycerin and heparin gtt. The patient mentioned that he unhooked his IVs to use the restroom and within minutes had chest pain. The patient did receive a Brilinta load upon arrival and his last dose was this morning. He received a total of 4 doses after the load.   Of note, the patient's wife was  emotional during my interview today. Her previous husband passed away 3 years ago at this time of the year    Current Activity/ Functional Status: Patient is independent with mobility/ambulation, transfers, ADL's, IADL's.   Zubrod  Score: At the time of surgery this patient's most appropriate activity status/level should be described as:     0    Normal activity, no symptoms     1    Restricted in physical strenuous activity but ambulatory, able to do out light work     2    Ambulatory and capable of self care, unable to do work activities, up and about                 more than 50%  Of the time                                3    Only limited self care, in bed greater than 50% of waking hours     4    Completely disabled, no self care, confined to bed or chair     5    Moribund  Past Medical History:  Diagnosis Date  . Anxiety     Past Surgical History:  Procedure Laterality Date  . LEFT HEART CATH AND CORONARY ANGIOGRAPHY N/A 06/11/2016   Procedure: Left Heart Cath and Coronary Angiography;  Surgeon: Yvonne Kendall, MD;  Location: [facility];  Service: Cardiovascular;  Laterality: N/A;    History  Smoking Status  . Never Smoker  Smokeless Tobacco  .  Never Used    History  Alcohol Use No    Social History   Social History  . Marital status: Single    Spouse name: N/A  . Number of children: N/A  . Years of education: N/A   Occupational History  . [employer]    Social History Main Topics  . Smoking status: Never Smoker  . Smokeless tobacco: Never Used  . Alcohol use No  . Drug use: No  . Sexual activity: Not on file      No Known Allergies  Current Facility-Administered Medications  Medication Dose Route Frequency Provider Last Rate Last Dose  . 0.9 %  sodium chloride infusion   Intravenous Continuous Wendall Stade, MD 75 mL/hr at 06/11/16 0055    . 0.9 %  sodium chloride infusion  250 mL Intravenous PRN Yvonne Kendall, MD      . acetaminophen (TYLENOL) tablet 650 mg  650 mg Oral Q4H PRN Greig Castilla Means, MD      . aspirin EC tablet 81 mg  81 mg Oral Daily Lum Babe, MD   Stopped at 06/11/16 1000  . atorvastatin (LIPITOR) tablet 80 mg  80 mg  Oral q1800 Greig Castilla Means, MD   80 mg at 06/10/16 1708  . furosemide (LASIX) injection 20 mg  20 mg Intravenous Daily Christopher End, MD      . metoprolol tartrate (LOPRESSOR) tablet 25 mg  25 mg Oral BID Christopher End, MD      . nitroGLYCERIN 50 mg in dextrose 5 % 250 mL (0.2 mg/mL) infusion  0-200 mcg/min Intravenous Titrated Greig Castilla Means, MD 3 mL/hr at 06/11/16 0407 10 mcg/min at 06/11/16 0407  . ondansetron (ZOFRAN) injection 4 mg  4 mg Intravenous Q6H PRN Greig Castilla Means, MD      . sodium chloride flush (NS) 0.9 % injection 3 mL  3 mL Intravenous Q12H Christopher End, MD      . sodium chloride flush (NS) 0.9 % injection 3 mL  3 mL Intravenous PRN Yvonne Kendall, MD        Prescriptions Prior to Admission  Medication Sig Dispense Refill Last Dose  . escitalopram (LEXAPRO) 10 MG tablet Take 10 mg by mouth daily.   06/07/2016  . [DISCONTINUED] citalopram (CELEXA) 10 MG tablet Take 10 mg by mouth daily.       Family History: Unknown since patient is adopted. Does have a daughter.    Review of Systems:      Cardiac Review of Systems: Y or N  Chest Pain [  yes  ]  Resting SOB [ no ] Exertional SOB  [ no ]  Orthopnea [ no ]   Pedal Edema [   ]    Palpitations [  ] Syncope  [  ]   Presyncope [   ]  General Review of Systems: [Y] = yes [  ]=no Constitional: recent weight change [ no ]; anorexia [  ]; fatigue [ no ]; nausea [ no ]; night sweats [  ]; fever [  ]; or chills [  ]                                                               Dental: poor dentition[  ];  Last Dentist visit:   Eye : blurred vision [  ]; diplopia [   ]; vision changes [  ];  Amaurosis fugax[  ]; Resp: cough [ no ];  wheezing[  ];  hemoptysis[  ]; shortness of breath[ no ]; paroxysmal nocturnal dyspnea[  ]; dyspnea on exertion[  ]; or orthopnea[  ];  GI:  gallstones[  ], vomiting[ no ];  dysphagia[  ]; melena[  ];  hematochezia [  ]; heartburn[  ];   Hx of  Colonoscopy[  ]; GU: kidney stones [   ]; hematuria[  ];   dysuria [  ];  nocturia[  ];  history of  obstruction [ no ]; urinary frequency [  ]             Skin: rash, swelling[  ];, hair loss[  ];  peripheral edema[ no ];  or itching[  ]; Musculosketetal: myalgias[ no ];  joint swelling[  ];  joint erythema[  ];  joint pain[  ];  back pain[  ];  Heme/Lymph: bruising[  ];  bleeding[  ];  anemia[  ];  Neuro: TIA[  ];  headaches[no  ];  stroke[  ];  vertigo[  ];  seizures[  ];   paresthesias[  ];  difficulty walking[no  ];  Psych:depression[ no ]; anxiety[ yes ];  Endocrine: diabetes[ no ];  thyroid dysfunction[ no ];  Immunizations: Flu [  ]; Pneumococcal[  ];  Other:  Physical Exam: BP 128/74   Pulse 71   Temp 98 F (36.7 C) (Oral)   Resp (!) 0   Ht  (1.905 m)   Wt 286 lb (129.7 kg)   SpO2 98%   BMI 35.75 kg/m    General appearance: alert, cooperative and no distress Resp: clear to auscultation bilaterally Cardio: regular rate and rhythm, S1, S2 normal, no murmur, click, rub or gallop GI: soft, non-tender; bowel sounds normal; no masses,  no organomegaly Extremities: extremities normal, atraumatic, no cyanosis or edema Palpable dp and pt pulses   Diagnostic Studies & Laboratory data:  Conclusions: 1. 3-vessel coronary artery disease, including 80% ostial and 90% mid LAD lesions. The mid LAD stenosis is a complex lesion involving ostium of a moderate-caliber first diagonal ([place] 1,1,1). 60% moderate-caliber OM1 as well as 60-70% proximal and 40% mid RCA lesions are also present. 2. Mildly reduced left ventricular contraction with apical anterior and apical hypokinesis. 3. Moderately elevated left ventricular filling pressure.  Recommendations: 1. Cardiac surgery consultation. Though RCA and OM disease is borderline, ostial through mid LAD disease is severe and involves a sizable diagonal branch that would be challenging to treat percutaneously. 2. Medical optimization, including discontinuation of ticagrelor  pending surgical consultation. 3. Restart heparin infusion 2 hours after TR band removal. 4. Gentle diuresis, given elevated LVEDP and mildly reduced LVEF.  Yvonne Kendall, MD [facility] Pager: [phone]   I have independently reviewed the above  cath films and reviewed the findings with the  patient .    Study Conclusions  - Left ventricle: The cavity size was normal. Wall thickness was   increased in a pattern of mild LVH. Systolic function was mildly   reduced. The estimated ejection fraction was in the range of 45%   to 50%. Diffuse hypokinesis. Although no diagnostic regional wall   motion abnormality was identified, this possibility cannot be   completely excluded on the basis of this study. Doppler   parameters are consistent with abnormal left ventricular  relaxation (grade 1 diastolic dysfunction). - Left atrium: The atrium was mildly dilated.  Impressions:  - Even with Definity contrast, wall motion and EF assessment are   difficult due to body habitus. Consider corroboration with   alternative imaging techniques.      Recent Radiology Findings:  PA and Lat ordered   I have independently reviewed the above radiologic studies.  Recent Lab Findings: Lab Results  Component Value Date   WBC 8.7 06/11/2016   HGB 12.3 (L) 06/11/2016   HCT 38.4 (L) 06/11/2016   PLT 184 06/11/2016   GLUCOSE 120 (H) 06/09/2016   CHOL 222 (H) 06/09/2016   TRIG 273 (H) 06/09/2016   HDL 41 06/09/2016   LDLCALC 126 (H) 06/09/2016   ALT 17 06/09/2016   AST 32 06/09/2016   NA 139 06/09/2016   K 4.0 06/09/2016   CL 106 06/09/2016   CREATININE 0.95 06/09/2016   BUN 17 06/09/2016   CO2 24 06/09/2016   INR 1.04 06/09/2016   HGBA1C 6.2 (H) 06/09/2016   Lab Results  Component Value Date   TROPONINI 2.42 (HH) 06/09/2016     ECHO: 05/2016 Transthoracic Echocardiography  Patient:    [name], [name] MR #:       [id number] Study Date: 06/09/2016 Gender:      M Age:        61 Height:     190.5 cm Weight:     131.5 kg BSA:        2.68 m^2 Pt. Status: Room:       3W24C   ADMITTING    Tobias Alexander, M.D.  ATTENDING    Tobias Alexander, M.D.  PERFORMING   [facility], Inpatient  SONOGRAPHER  Dominica Severin, RCS  ORDERING     Means, Greig Castilla  REFERRING    Means, Greig Castilla  cc:  ------------------------------------------------------------------- LV EF: 45% -   50%  ------------------------------------------------------------------- Indications:      Chest pain.  ------------------------------------------------------------------- History:   Risk factors:  Lifelong nonsmoker. Morbidly obese. Increased troponin levels this admission.  ------------------------------------------------------------------- Study Conclusions  - Left ventricle: The cavity size was normal. Wall thickness was   increased in a pattern of mild LVH. Systolic function was mildly   reduced. The estimated ejection fraction was in the range of 45%   to 50%. Diffuse hypokinesis. Although no diagnostic regional wall   motion abnormality was identified, this possibility cannot be   completely excluded on the basis of this study. Doppler   parameters are consistent with abnormal left ventricular   relaxation (grade 1 diastolic dysfunction). - Left atrium: The atrium was mildly dilated.  Impressions:  - Even with Definity contrast, wall motion and EF assessment are   difficult due to body habitus. Consider corroboration with   alternative imaging techniques.  ------------------------------------------------------------------- Study data:  No prior study was available for comparison.  Study status:  Routine.  Procedure:  Transthoracic echocardiography. Image quality was poor. The study was technically difficult, as a result of poor acoustic windows and body habitus. Intravenous contrast (Definity) was administered.  Study completion:  There were no  complications.          Transthoracic echocardiography. M-mode, complete 2D, spectral Doppler, and color Doppler. Birthdate:  Patient birthdate: [birth year].  Age:  Patient is 61 yr old.  Sex:  Gender: male.    BMI: 36.2 kg/m^2.  Blood pressure: 116/77  Patient status:  Inpatient.  Study date:  Study date: 06/09/2016. Study time: 02:11 PM.  Location:  Bedside.  -------------------------------------------------------------------  ------------------------------------------------------------------- Left ventricle:  The cavity size was normal. Wall thickness was increased in a pattern of mild LVH. Systolic function was mildly reduced. The estimated ejection fraction was in the range of 45% to 50%. Diffuse hypokinesis. Although no diagnostic regional wall motion abnormality was identified, this possibility cannot be completely excluded on the basis of this study. Doppler parameters are consistent with abnormal left ventricular relaxation (grade 1 diastolic dysfunction).  ------------------------------------------------------------------- Aortic valve:   Trileaflet; normal thickness leaflets. Mobility was not restricted.  Doppler:  Transvalvular velocity was within the normal range. There was no stenosis. There was no regurgitation.   ------------------------------------------------------------------- Aorta:  Aortic root: The aortic root was normal in size.  ------------------------------------------------------------------- Mitral valve:   Structurally normal valve.   Mobility was not restricted.  Doppler:  Transvalvular velocity was within the normal range. There was no evidence for stenosis. There was no regurgitation.    Valve area by pressure half-time: 3.61 cm^2. Indexed valve area by pressure half-time: 1.35 cm^2/m^2.    Peak gradient (D): 2 mm Hg.  ------------------------------------------------------------------- Left atrium:  The atrium was mildly  dilated.  ------------------------------------------------------------------- Right ventricle:  The cavity size was normal. Wall thickness was normal. Systolic function was normal.  ------------------------------------------------------------------- Pulmonic valve:   Poorly visualized.  The valve appears to be grossly normal.    Doppler:  Transvalvular velocity was within the normal range. There was no evidence for stenosis.  ------------------------------------------------------------------- Tricuspid valve:   Structurally normal valve.    Doppler: Transvalvular velocity was within the normal range. There was no regurgitation.  ------------------------------------------------------------------- Pulmonary artery:   The main pulmonary artery was normal-sized. Systolic pressure was within the normal range.  ------------------------------------------------------------------- Right atrium:  The atrium was normal in size.  ------------------------------------------------------------------- Pericardium:  There was no pericardial effusion.  ------------------------------------------------------------------- Systemic veins: Inferior vena cava: The vessel was normal in size.  ------------------------------------------------------------------- Measurements   Left ventricle                          Value          Reference  LV ID, ED, PLAX chordal         (H)     58    mm       43 - 52  LV ID, ES, PLAX chordal                 38    mm       23 - 38  LV fx shortening, PLAX chordal          34    %        >=29  LV PW thickness, ED                     12    mm       ----------  IVS/LV PW ratio, ED                     1              <=1.3  LV e&', lateral                          6.2   cm/s     ----------  LV E/e&', lateral  12.44          ----------  LV e&', medial                           8.49  cm/s     ----------  LV E/e&', medial                          9.08           ----------  LV e&', average                          7.35  cm/s     ----------  LV E/e&', average                        10.5           ----------    Ventricular septum                      Value          Reference  IVS thickness, ED                       12    mm       ----------    LVOT                                    Value          Reference  LVOT ID, S                              23    mm       ----------  LVOT area                               4.15  cm^2     ----------    Aorta                                   Value          Reference  Aortic root ID, ED                      34    mm       ----------  Ascending aorta ID, A-P, S              31    mm       ----------    Left atrium                             Value          Reference  LA ID, A-P, ES                          40    mm       ----------  LA ID/bsa, A-P  1.49  cm/m^2   <=2.2  LA volume, ES, 1-p A4C                  39.6  ml       ----------  LA volume/bsa, ES, 1-p A4C              14.8  ml/m^2   ----------    Mitral valve                            Value          Reference  Mitral E-wave peak velocity             77.1  cm/s     ----------  Mitral deceleration time                208   ms       150 - 230  Mitral pressure half-time               61    ms       ----------  Mitral peak gradient, D                 2     mm Hg    ----------  Mitral valve area, PHT, DP              3.61  cm^2     ----------  Mitral valve area/bsa, PHT, DP          1.35  cm^2/m^2 ----------    Right atrium                            Value          Reference  RA ID, S-I, ES, A4C             (H)     70.7  mm       34 - 49  RA area, ES, A4C                (H)     31.7  cm^2     8.3 - 19.5  RA volume, ES, A/L                      112   ml       ----------  RA volume/bsa, ES, A/L                  41.8  ml/m^2   ----------    Right ventricle                         Value          Reference  RV ID, ED,  PLAX                 (H)     42    mm       19 - 38  TAPSE                                   21.4  mm       ----------  RV s&', lateral, S  20.2  cm/s     ----------    Pulmonic valve                          Value          Reference  Pulmonic valve peak velocity, S         79.1  cm/s     ----------  Legend: (L)  and  (H)  mark values outside specified reference range.  ------------------------------------------------------------------- Prepared and Electronically Authenticated by  Thurmon Fair, MD 2018-03-31T17:14:47  Assessment / Plan:    1/ SEMI with 3 vessel coronary artery disease esp involving lad and diagonal - loaded and continued on Brilinta,  I agree with Dr End, CABG offers best revascularization option with complex proximal lad/diag disease. Will check p2y12 in am , likely will have to wait until Friday for Brilinta  wash out. Risks and options discussed with patient in detail and he is willing to proceed with CABG  I  spent 40 minutes counseling the patient face to face and 50% or more the  time was spent in counseling and coordination of care. The total time spent in the appointment was 60  minutes.    [name] MD      [address] [place] [place] [address] Office [phone]   Beeper [phone]  06/11/2016 11:22 AM

## 2016-06-11 NOTE — Interval H&P Note (Signed)
History and Physical Interval Note:  06/11/2016 8:42 AM  [name]  has presented today for cardiac catheterization, with the diagnosis of NSTEMI. The various methods of treatment have been discussed with the patient and family. After consideration of risks, benefits and other options for treatment, the patient has consented to  Procedure(s): Left Heart Cath and Coronary Angiography (N/A) as a surgical intervention .  The patient's history has been reviewed, patient examined, no change in status, stable for surgery.  I have reviewed the patient's chart and labs.  Questions were answered to the patient's satisfaction.    Cath Lab Visit (complete for each Cath Lab visit)  Clinical Evaluation Leading to the Procedure:   ACS: Yes.    Non-ACS: (N/A)  [name]

## 2016-06-11 NOTE — Progress Notes (Signed)
ANTICOAGULATION CONSULT NOTE - Follow Up Consult  Pharmacy Consult for Heparin Indication: chest pain/ACS, 3V CAD  No Known Allergies  Patient Measurements: Height:  (190.5 cm) Weight: 286 lb (129.7 kg) IBW/kg (Calculated) : 84.5 Heparin Dosing Weight: 110 kg  Vital Signs: Temp: 98 F (36.7 C) (04/02 0736) Temp Source: Oral (04/02 0736) BP: 103/69 (04/02 1406) Pulse Rate: 102 (04/02 1406)  Labs:  Recent Labs  06/09/16 0246  06/09/16 1005 06/09/16 1615 06/09/16 2157 06/10/16 0355 06/11/16 0319  HGB 12.8*  --   --   --   --  12.0* 12.3*  HCT 39.7  --   --   --   --  37.9* 38.4*  PLT 196  --   --   --   --  176 184  APTT 65*  --   --   --   --   --   --   LABPROT 13.7  --   --   --   --   --   --   INR 1.04  --   --   --   --   --   --   HEPARINUNFRC  --   < > 0.38  --   --  0.50 0.45  CREATININE 0.95  --   --   --   --   --   --   TROPONINI 2.65*  --  2.07* 3.03* 2.42*  --   --   < > = values in this interval not displayed.  Estimated Creatinine Clearance: 117 mL/min (by C-G formula based on SCr of 0.95 mg/dL).  Assessment:  61 yr old male admitted on 3/31 with chest pain/NSTEMI.  Heparin level therapeutic this morning (0.45) on 1750 units/hr.   Has had 4 consecutive therapeutic heparin levels on the same infusion rate.   s/p cardiac cath today, 3V CAD, for TCTS consult.  Heparin to resume 2 hrs after TR band off.  Band off ~2pm. No bleeding or hematoma reported.  Goal of Therapy:  Heparin level 0.3-0.7 units/ml Monitor platelets by anticoagulation protocol: Yes   Plan:   Resume heparin drip at 4pm at 1750 units/hr.  Continue daily heparin level and CBC.  Dennie Fetters, [place] Pager: [phone] 06/11/2016,2:28 PM

## 2016-06-11 NOTE — H&P (View-Only) (Signed)
Progress Note  Patient Name: [name] Date of Encounter: 06/10/2016  Primary Cardiologist: New/Nishan  Subjective   No chest pain this am Wife has had stent with Dr Tresa Endo and sees [facility] in [place]  Inpatient Medications    Scheduled Meds: . aspirin EC  81 mg Oral Daily  . atorvastatin  80 mg Oral q1800  . metoprolol tartrate  12.5 mg Oral BID  . ticagrelor  90 mg Oral BID   Continuous Infusions: . sodium chloride 75 mL/hr at 06/09/16 1800  . heparin 1,750 Units/hr (06/10/16 0315)  . nitroGLYCERIN 10 mcg/min (06/10/16 0437)   PRN Meds: acetaminophen, ondansetron (ZOFRAN) IV   Vital Signs    Vitals:   06/10/16 0205 06/10/16 0316 06/10/16 0432 06/10/16 0740  BP: 118/73  136/88   Pulse: 70  73 73  Resp: 20     Temp: 98.2 F (36.8 C)  97.9 F (36.6 C) 97.7 F (36.5 C)  TempSrc: Oral  Oral Oral  SpO2: 97%  98%   Weight:  288 lb 1.6 oz (130.7 kg)    Height:        Intake/Output Summary (Last 24 hours) at 06/10/16 7829 Last data filed at 06/10/16 0700  Gross per 24 hour  Intake              480 ml  Output             2675 ml  Net            -2195 ml   Filed Weights   06/09/16 0229 06/10/16 0316  Weight: 289 lb 14.5 oz (131.5 kg) 288 lb 1.6 oz (130.7 kg)    Telemetry    NSR no arrhythmia 06/10/2016  - Personally Reviewed  ECG    SR lateral J point elevation I,AVL poor R wave progression  - Personally Reviewed  Physical Exam  Obese white male  GEN: No acute distress.   Neck: No JVD Cardiac: RRR, no murmurs, rubs, or gallops.  Respiratory: Clear to auscultation bilaterally. GI: Soft, nontender, non-distended  MS: No edema; No deformity. Neuro:  Nonfocal  Psych: Normal affect   Labs    Chemistry  Recent Labs Lab 06/09/16 0246  NA 139  K 4.0  CL 106  CO2 24  GLUCOSE 120*  BUN 17  CREATININE 0.95  CALCIUM 8.6*  PROT 6.5  ALBUMIN 3.5  AST 32  ALT 17  ALKPHOS 46  BILITOT 0.5  GFRNONAA >60  GFRAA >60  ANIONGAP 9      Hematology  Recent Labs Lab 06/09/16 0246 06/10/16 0355  WBC 8.9 7.1  RBC 4.23 4.04*  HGB 12.8* 12.0*  HCT 39.7 37.9*  MCV 93.9 93.8  MCH 30.3 29.7  MCHC 32.2 31.7  RDW 13.6 14.0  PLT 196 176    Cardiac Enzymes  Recent Labs Lab 06/09/16 0246 06/09/16 1005 06/09/16 1615 06/09/16 2157  TROPONINI 2.65* 2.07* 3.03* 2.42*   No results for input(s): TROPIPOC in the last 168 hours.   BNP  Recent Labs Lab 06/09/16 0246  BNP 81.3     DDimer No results for input(s): DDIMER in the last 168 hours.   Radiology    No results found.  Cardiac Studies   Echo Pending   Patient Profile     61 y.o. male SSCP started Thursday Transferred from [place] for positive troponin And cath on Monday   Assessment & Plan    1) SEMI:  Pain free on heparin BP  better with heparin  Circumflex MI with lateral J point Elevation. ECG this am normal with insignificant q waves in 3,F Loaded with Brillinta by fellow  Troponin coming down already  Will continue although hope patient does not have surgical disease.Continue ASA/beta blocker On high dose statin  Cath in am    Signed, Charlton Haws, MD  06/10/2016, 9:21 AM

## 2016-06-12 ENCOUNTER — Other Ambulatory Visit (HOSPITAL_COMMUNITY): Payer: Non-veteran care

## 2016-06-12 ENCOUNTER — Inpatient Hospital Stay (HOSPITAL_COMMUNITY): Payer: Non-veteran care

## 2016-06-12 DIAGNOSIS — I209 Angina pectoris, unspecified: Secondary | ICD-10-CM

## 2016-06-12 DIAGNOSIS — I2510 Atherosclerotic heart disease of native coronary artery without angina pectoris: Secondary | ICD-10-CM

## 2016-06-12 LAB — PULMONARY FUNCTION TEST
DL/VA % pred: 98 %
DL/VA: 4.74 ml/min/mmHg/L
DLCO cor % pred: 87 %
DLCO cor: 32.9 ml/min/mmHg
DLCO unc % pred: 83 %
DLCO unc: 31.53 ml/min/mmHg
FEF 25-75 Post: 3.54 L/sec
FEF 25-75 Pre: 2.7 L/sec
FEF2575-%Change-Post: 31 %
FEF2575-%Pred-Post: 109 %
FEF2575-%Pred-Pre: 83 %
FEV1-%Change-Post: 7 %
FEV1-%Pred-Post: 91 %
FEV1-%Pred-Pre: 85 %
FEV1-Post: 3.7 L
FEV1-Pre: 3.46 L
FEV1FVC-%Change-Post: 1 %
FEV1FVC-%Pred-Pre: 99 %
FEV6-%Change-Post: 4 %
FEV6-%Pred-Post: 93 %
FEV6-%Pred-Pre: 89 %
FEV6-Post: 4.83 L
FEV6-Pre: 4.61 L
FEV6FVC-%Change-Post: 0 %
FEV6FVC-%Pred-Post: 105 %
FEV6FVC-%Pred-Pre: 105 %
FVC-%Change-Post: 5 %
FVC-%Pred-Post: 89 %
FVC-%Pred-Pre: 85 %
FVC-Post: 4.84 L
FVC-Pre: 4.61 L
Post FEV1/FVC ratio: 76 %
Post FEV6/FVC ratio: 100 %
Pre FEV1/FVC ratio: 75 %
Pre FEV6/FVC Ratio: 100 %
RV % pred: 98 %
RV: 2.46 L
TLC % pred: 93 %
TLC: 7.33 L

## 2016-06-12 LAB — CBC
HCT: 40.4 % (ref 39.0–52.0)
Hemoglobin: 13.2 g/dL (ref 13.0–17.0)
MCH: 30.2 pg (ref 26.0–34.0)
MCHC: 32.7 g/dL (ref 30.0–36.0)
MCV: 92.4 fL (ref 78.0–100.0)
Platelets: 185 10*3/uL (ref 150–400)
RBC: 4.37 MIL/uL (ref 4.22–5.81)
RDW: 13.7 % (ref 11.5–15.5)
WBC: 8.5 10*3/uL (ref 4.0–10.5)

## 2016-06-12 LAB — HEPARIN LEVEL (UNFRACTIONATED): Heparin Unfractionated: 0.48 IU/mL (ref 0.30–0.70)

## 2016-06-12 LAB — PLATELET INHIBITION P2Y12: Platelet Function  P2Y12: 158 [PRU] — ABNORMAL LOW (ref 194–418)

## 2016-06-12 MED ORDER — ESCITALOPRAM OXALATE 10 MG PO TABS
10.0000 mg | ORAL_TABLET | Freq: Every day | ORAL | Status: DC
  Administered 2016-06-12 – 2016-06-20 (×8): 10 mg via ORAL
  Filled 2016-06-12 (×8): qty 1

## 2016-06-12 MED ORDER — ALPRAZOLAM 0.5 MG PO TABS
0.5000 mg | ORAL_TABLET | Freq: Two times a day (BID) | ORAL | Status: DC | PRN
  Administered 2016-06-12: 0.5 mg via ORAL
  Filled 2016-06-12: qty 1

## 2016-06-12 MED ORDER — ALBUTEROL SULFATE (2.5 MG/3ML) 0.083% IN NEBU
2.5000 mg | INHALATION_SOLUTION | Freq: Once | RESPIRATORY_TRACT | Status: AC
  Administered 2016-06-12: 2.5 mg via RESPIRATORY_TRACT

## 2016-06-12 NOTE — Progress Notes (Signed)
ANTICOAGULATION CONSULT NOTE - Follow Up Consult  Pharmacy Consult for Heparin Indication: chest pain/ACS, 3V CAD  No Known Allergies  Patient Measurements: Height:  (190.5 cm) Weight: 286 lb (129.7 kg) IBW/kg (Calculated) : 84.5 Heparin Dosing Weight: 110 kg  Vital Signs: Temp: 97.5 F (36.4 C) (04/03 1007) Temp Source: Oral (04/03 1007) BP: 114/73 (04/03 1039) Pulse Rate: 99 (04/03 1039)  Labs:  Recent Labs  06/09/16 1615 06/09/16 2157  06/10/16 0355 06/11/16 0319 06/12/16 0429 06/12/16 0823  HGB  --   --   < > 12.0* 12.3* 13.2  --   HCT  --   --   --  37.9* 38.4* 40.4  --   PLT  --   --   --  176 184 185  --   HEPARINUNFRC  --   --   --  0.50 0.45  --  0.48  TROPONINI 3.03* 2.42*  --   --   --   --   --   < > = values in this interval not displayed.  Estimated Creatinine Clearance: 117 mL/min (by C-G formula based on SCr of 0.95 mg/dL).  Assessment:  61 yr old male admitted on 3/31 with chest pain/NSTEMI.  Heparin level therapeutic this morning (0.48) on 1750 units/hr.   Has had 5 consecutive therapeutic heparin levels on the same infusion rate.   s/p cardiac cath on 4/2, 3V CAD. For OHS on 4/6 after Brlinta washout. Last Brilinta dose 4/2 am.  Goal of Therapy:  Heparin level 0.3-0.7 units/ml Monitor platelets by anticoagulation protocol: Yes   Plan:   Continue heparin drip at 1750 units/hr.  Continue daily heparin level and CBC.  Dennie Fetters, [place] Pager: [phone] 06/12/2016,11:01 AM

## 2016-06-12 NOTE — Progress Notes (Signed)
CARDIAC REHAB PHASE I   PRE:  Rate/Rhythm: 89 SR    BP: sitting 130/79    SaO2:   MODE:  Ambulation: 350 ft   POST:  Rate/Rhythm: 102 ST    BP: sitting 141/84     SaO2:   Tolerated well, no angina noted. To recliner. Discussed with pt and wife IS, sternal precautions, mobility, d/c planning. Gave booklet, careguide, and videos to watch. Very receptive. Encouraged mobility but to report angina to RN if any.  [id number]   Harriet Masson CES, ACSM 06/12/2016 10:02 AM

## 2016-06-12 NOTE — Progress Notes (Signed)
Progress Note  Patient Name: [name] Date of Encounter: 06/12/2016  Primary Cardiologist: New to Dr. Eden Michael   Subjective   Feeling well. No chest pain, sob or palpitations. Per nurse patient is very anxious. Will resume home Lexapro.  Inpatient Medications    Scheduled Meds: . aspirin EC  81 mg Oral Daily  . atorvastatin  80 mg Oral q1800  . escitalopram  10 mg Oral Daily  . furosemide  20 mg Intravenous Daily  . metoprolol tartrate  25 mg Oral BID  . sodium chloride flush  3 mL Intravenous Q12H   Continuous Infusions: . sodium chloride Stopped (06/11/16 1136)  . heparin 1,750 Units/hr (06/12/16 0831)  . nitroGLYCERIN 10 mcg/min (06/11/16 0407)   PRN Meds: sodium chloride, acetaminophen, ALPRAZolam, ondansetron (ZOFRAN) IV, sodium chloride flush   Vital Signs    Vitals:   06/11/16 1335 06/11/16 1406 06/11/16 1530 06/11/16 2142  BP: 112/78 103/69  115/67  Pulse:  (!) 102  80  Resp:      Temp:   98.1 F (36.7 C) 98 F (36.7 C)  TempSrc:   Oral Oral  SpO2:  96%  96%  Weight:      Height:        Intake/Output Summary (Last 24 hours) at 06/12/16 0920 Last data filed at 06/12/16 0853  Gross per 24 hour  Intake          2955.83 ml  Output             2300 ml  Net           655.83 ml   Filed Weights   06/09/16 0229 06/10/16 0316 06/11/16 0608  Weight: 289 lb 14.5 oz (131.5 kg) 288 lb 1.6 oz (130.7 kg) 286 lb (129.7 kg)    Telemetry    NSR - Personally Reviewed  ECG    None today   Physical Exam   GEN: No acute distress.   Neck: No JVD Cardiac: RRR, no murmurs, rubs, or gallops. R radial cath site without hematoma Respiratory: Clear to auscultation bilaterally. GI: Soft, nontender, non-distended  MS: No edema; No deformity. Neuro:  Nonfocal  Psych: Normal affect   Labs    Chemistry Recent Labs Lab 06/09/16 0246  NA 139  K 4.0  CL 106  CO2 24  GLUCOSE 120*  BUN 17  CREATININE 0.95  CALCIUM 8.6*  PROT 6.5  ALBUMIN 3.5  AST 32    ALT 17  ALKPHOS 46  BILITOT 0.5  GFRNONAA >60  GFRAA >60  ANIONGAP 9     Hematology Recent Labs Lab 06/10/16 0355 06/11/16 0319 06/12/16 0429  WBC 7.1 8.7 8.5  RBC 4.04* 4.13* 4.37  HGB 12.0* 12.3* 13.2  HCT 37.9* 38.4* 40.4  MCV 93.8 93.0 92.4  MCH 29.7 29.8 30.2  MCHC 31.7 32.0 32.7  RDW 14.0 14.0 13.7  PLT 176 184 185    Cardiac Enzymes Recent Labs Lab 06/09/16 0246 06/09/16 1005 06/09/16 1615 06/09/16 2157  TROPONINI 2.65* 2.07* 3.03* 2.42*   No results for input(s): TROPIPOC in the last 168 hours.   BNP Recent Labs Lab 06/09/16 0246  BNP 81.3     DDimer No results for input(s): DDIMER in the last 168 hours.   Radiology    Dg Chest 2 View  Result Date: 06/12/2016 CLINICAL DATA:  Preoperative examination prior to CABG. History of previous MI, obesity. EXAM: CHEST  2 VIEW COMPARISON:  Portable chest x-ray of June 08, 2016 FINDINGS: The lungs are adequately inflated and clear. The heart is top-normal in size. The pulmonary vascularity is normal. There is calcification in the wall of the aortic arch. The mediastinum is normal in width. There is no pleural effusion. The bony thorax is unremarkable. IMPRESSION: There is no acute cardiopulmonary abnormality. Thoracic aortic atherosclerosis. Electronically Signed   By: Brian  Michael M.D.   On: 06/12/2016 07:21    Cardiac Studies   Left Heart Cath and Coronary Angiography    06/11/16  Conclusion   Conclusions: 1. 3-vessel coronary artery disease, including 80% ostial and 90% mid LAD lesions. The mid LAD stenosis is a complex lesion involving ostium of a moderate-caliber first diagonal ([place] 1,1,1). 60% moderate-caliber OM1 as well as 60-70% proximal and 40% mid RCA lesions are also present. 2. Mildly reduced left ventricular contraction with apical anterior and apical hypokinesis. 3. Moderately elevated left ventricular filling pressure.  Recommendations: 1. Cardiac surgery consultation. Though RCA and OM  disease is borderline, ostial through mid LAD disease is severe and involves a sizable diagonal branch that would be challenging to treat percutaneously. 2. Medical optimization, including discontinuation of ticagrelor pending surgical consultation. 3. Restart heparin infusion 2 hours after TR band removal. 4. Gentle diuresis, given elevated LVEDP and mildly reduced LVEF.   Echo 06/09/16 Study Conclusions  - Left ventricle: The cavity size was normal. Wall thickness was   increased in a pattern of mild LVH. Systolic function was mildly   reduced. The estimated ejection fraction was in the range of 45%   to 50%. Diffuse hypokinesis. Although no diagnostic regional wall   motion abnormality was identified, this possibility cannot be   completely excluded on the basis of this study. Doppler   parameters are consistent with abnormal left ventricular   relaxation (grade 1 diastolic dysfunction). - Left atrium: The atrium was mildly dilated.  Impressions:  - Even with Definity contrast, wall motion and EF assessment are   difficult due to body habitus. Consider corroboration with   alternative imaging techniques.  Patient Profile     Mr. [name] is a 61 year old male without known CAD presenting as a transfer from [facility] emergency room with chest pain, found to have NSTEMI.   Assessment & Plan    1. NSTEMI - Peak of troponin 3.03. Cath showed severe 3V disease. Seen by CT surgery and plan for CABG Friday.  - Echo showed LV function of 45-50%, diffuse hypokinesis, grade 1 DD.  - Chest pain free. Continue ASA, Statin, BB and IV heparin.   2. HLD - 06/09/2016: Cholesterol 222; HDL 41; LDL Cholesterol 126; Triglycerides 273; VLDL 55  - Continue statin. Will need lipide panel and LFT in 6 weeks.   3. Acute combined CHF - Echo as above. Euvolemic. Continue low dose lasix and BB. Consider adding ACE/ARB. BP stable.   Signed, Brian Passey, PA  06/12/2016, 9:20 AM   \ PT seen and  examined  [name] reviewed   Pt currently comfortable I agree with findings as noted by B  [name] above  ON exam Lungs are CTA  Cardiac exam RRR  No S3  Ext without edema  Plan for CABG on Fri since he received Brilinta.   Keep on heparin for now.    [name]

## 2016-06-12 NOTE — Progress Notes (Signed)
Patient ID: [name], male   DOB: 1955/04/21, 61 y.o.   MRN: [id number]      [address]       [name] [address]             [phone]                 1 Day Post-Op Procedure(s) (LRB): Left Heart Cath and Coronary Angiography (N/A)  LOS: 3 days   Subjective: No chest pain, up to chair , feels well on ntg and heparin   Objective: Vital signs in last 24 hours: Patient Vitals for the past 24 hrs:  BP Temp Temp src Pulse Resp SpO2  06/12/16 1007 120/70 97.5 F (36.4 C) Oral 100 18 98 %  06/11/16 2142 115/67 98 F (36.7 C) Oral 80 - 96 %  06/11/16 1530 - 98.1 F (36.7 C) Oral - - -  06/11/16 1406 103/69 - - (!) 102 - 96 %  06/11/16 1335 112/78 - - - - -  06/11/16 1310 120/81 - - 99 - 98 %  06/11/16 1235 115/77 - - 94 - 97 %  06/11/16 1205 120/84 - - 91 - 98 %  06/11/16 1135 126/75 - - 73 - 98 %  06/11/16 1105 128/74 - - 71 - 98 %  06/11/16 1050 138/85 - - 74 - 99 %  06/11/16 1035 126/79 - - 68 - 96 %    Filed Weights   06/09/16 0229 06/10/16 0316 06/11/16 0608  Weight: 289 lb 14.5 oz (131.5 kg) 288 lb 1.6 oz (130.7 kg) 286 lb (129.7 kg)    Hemodynamic parameters for last 24 hours:    Intake/Output from previous day: 04/02 0701 - 04/03 0700 In: 3482.7 [P.O.:960; I.V.:2482.7] Out: 2300 [Urine:2300] Intake/Output this shift: Total I/O In: 240 [P.O.:240] Out: -   Scheduled Meds: . aspirin EC  81 mg Oral Daily  . atorvastatin  80 mg Oral q1800  . escitalopram  10 mg Oral Daily  . furosemide  20 mg Intravenous Daily  . metoprolol tartrate  25 mg Oral BID  . sodium chloride flush  3 mL Intravenous Q12H   Continuous Infusions: . sodium chloride Stopped (06/11/16 1136)  . heparin 1,750 Units/hr (06/12/16 0831)  . nitroGLYCERIN 10 mcg/min (06/11/16 0407)   PRN Meds:.sodium chloride, acetaminophen, ALPRAZolam, ondansetron (ZOFRAN) IV, sodium chloride flush  General appearance: alert and cooperative Neurologic: intact Heart: regular rate  and rhythm, S1, S2 normal, no murmur, click, rub or gallop Lungs: clear to auscultation bilaterally Abdomen: soft, non-tender; bowel sounds normal; no masses,  no organomegaly Extremities: extremities normal, atraumatic, no cyanosis or edema and Homans sign is negative, no sign of DVT Wound: right wrist cath site ok  Lab Results: CBC: Recent Labs  06/11/16 0319 06/12/16 0429  WBC 8.7 8.5  HGB 12.3* 13.2  HCT 38.4* 40.4  PLT 184 185   BMET: No results for input(s): NA, K, CL, CO2, GLUCOSE, BUN, CREATININE, CALCIUM in the last 72 hours.  PT/INR: No results for input(s): LABPROT, INR in the last 72 hours.   Radiology Dg Chest 2 View  Result Date: 06/12/2016 CLINICAL DATA:  Preoperative examination prior to CABG. History of previous MI, obesity. EXAM: CHEST  2 VIEW COMPARISON:  Portable chest x-ray of June 08, 2016 FINDINGS: The lungs are adequately inflated and clear. The heart is top-normal in size. The pulmonary vascularity is normal. There is calcification in the wall of the aortic arch. The mediastinum is  normal in width. There is no pleural effusion. The bony thorax is unremarkable. IMPRESSION: There is no acute cardiopulmonary abnormality. Thoracic aortic atherosclerosis. Electronically Signed   By: David  [place] M.D.   On: 06/12/2016 07:21     Assessment/Plan: S/P Procedure(s) (LRB): Left Heart Cath and Coronary Angiography (N/A) Mobilize Chest xray done , noted, pfts ok P2Y12- 150- so will plan on cabg Friday not tomorrow due to the Brilinta loading done before cath , discussed with patient and wife   [name] MD 06/12/2016 10:29 AM

## 2016-06-13 ENCOUNTER — Inpatient Hospital Stay (HOSPITAL_COMMUNITY): Payer: Non-veteran care

## 2016-06-13 DIAGNOSIS — Z01810 Encounter for preprocedural cardiovascular examination: Secondary | ICD-10-CM

## 2016-06-13 LAB — CBC
HCT: 42.3 % (ref 39.0–52.0)
HEMOGLOBIN: 13.6 g/dL (ref 13.0–17.0)
MCH: 30 pg (ref 26.0–34.0)
MCHC: 32.2 g/dL (ref 30.0–36.0)
MCV: 93.2 fL (ref 78.0–100.0)
Platelets: 216 10*3/uL (ref 150–400)
RBC: 4.54 MIL/uL (ref 4.22–5.81)
RDW: 14.2 % (ref 11.5–15.5)
WBC: 9 10*3/uL (ref 4.0–10.5)

## 2016-06-13 LAB — HEPARIN LEVEL (UNFRACTIONATED): Heparin Unfractionated: 0.38 IU/mL (ref 0.30–0.70)

## 2016-06-13 MED ORDER — CYCLOBENZAPRINE HCL 10 MG PO TABS
5.0000 mg | ORAL_TABLET | Freq: Three times a day (TID) | ORAL | Status: DC | PRN
  Administered 2016-06-13: 5 mg via ORAL
  Filled 2016-06-13: qty 1

## 2016-06-13 NOTE — Progress Notes (Signed)
Progress Note  Patient Name: [name] Date of Encounter: 06/13/2016  Primary Cardiologist: New  ([place])  Subjective   No CP  No SOB    Inpatient Medications    Scheduled Meds: . aspirin EC  81 mg Oral Daily  . atorvastatin  80 mg Oral q1800  . escitalopram  10 mg Oral Daily  . furosemide  20 mg Intravenous Daily  . metoprolol tartrate  25 mg Oral BID  . sodium chloride flush  3 mL Intravenous Q12H   Continuous Infusions: . sodium chloride Stopped (06/11/16 1136)  . heparin 1,750 Units/hr (06/12/16 1700)  . nitroGLYCERIN 10 mcg/min (06/12/16 1700)   PRN Meds: sodium chloride, acetaminophen, ALPRAZolam, ondansetron (ZOFRAN) IV, sodium chloride flush   Vital Signs    Vitals:   06/12/16 1127 06/12/16 1549 06/12/16 2005 06/13/16 0525  BP: 126/71 (!) 121/59 116/75 127/72  Pulse: 98 88 84 77  Resp: 18 18    Temp: 97.6 F (36.4 C) 97.4 F (36.3 C) 98.4 F (36.9 C) 98.2 F (36.8 C)  TempSrc: Oral Oral Oral Oral  SpO2: 100% 97% 97% 97%  Weight:    281 lb 12.8 oz (127.8 kg)  Height:        Intake/Output Summary (Last 24 hours) at 06/13/16 0819 Last data filed at 06/13/16 0526  Gross per 24 hour  Intake           1245.5 ml  Output              275 ml  Net            970.5 ml   Filed Weights   06/10/16 0316 06/11/16 0608 06/13/16 0525  Weight: 288 lb 1.6 oz (130.7 kg) 286 lb (129.7 kg) 281 lb 12.8 oz (127.8 kg)    Telemetry    SR- Personally Reviewed  ECG    Physical Exam   GEN: No acute distress.   Neck: No JVD Cardiac: RRR, no murmurs, rubs, or gallops.  Respiratory: Clear to auscultation bilaterally. GI: Soft, nontender, non-distended  MS: No edema; No deformity. Neuro:  Nonfocal  Psych: Normal affect   Labs    Chemistry Recent Labs Lab 06/09/16 0246  NA 139  K 4.0  CL 106  CO2 24  GLUCOSE 120*  BUN 17  CREATININE 0.95  CALCIUM 8.6*  PROT 6.5  ALBUMIN 3.5  AST 32  ALT 17  ALKPHOS 46  BILITOT 0.5  GFRNONAA >60  GFRAA  >60  ANIONGAP 9     Hematology Recent Labs Lab 06/11/16 0319 06/12/16 0429 06/13/16 0453  WBC 8.7 8.5 9.0  RBC 4.13* 4.37 4.54  HGB 12.3* 13.2 13.6  HCT 38.4* 40.4 42.3  MCV 93.0 92.4 93.2  MCH 29.8 30.2 30.0  MCHC 32.0 32.7 32.2  RDW 14.0 13.7 14.2  PLT 184 185 216    Cardiac Enzymes Recent Labs Lab 06/09/16 0246 06/09/16 1005 06/09/16 1615 06/09/16 2157  TROPONINI 2.65* 2.07* 3.03* 2.42*   No results for input(s): TROPIPOC in the last 168 hours.   BNP Recent Labs Lab 06/09/16 0246  BNP 81.3     DDimer No results for input(s): DDIMER in the last 168 hours.   Radiology    Dg Chest 2 View  Result Date: 06/12/2016 CLINICAL DATA:  Preoperative examination prior to CABG. History of previous MI, obesity. EXAM: CHEST  2 VIEW COMPARISON:  Portable chest x-ray of June 08, 2016 FINDINGS: The lungs are adequately inflated and clear. The  heart is top-normal in size. The pulmonary vascularity is normal. There is calcification in the wall of the aortic arch. The mediastinum is normal in width. There is no pleural effusion. The bony thorax is unremarkable. IMPRESSION: There is no acute cardiopulmonary abnormality. Thoracic aortic atherosclerosis. Electronically Signed   By: David  [place] M.D.   On: 06/12/2016 07:21    Cardiac Studies    Patient Profile     61 y.o. male presented for CP and NSTEMI  Found to have 3 V CAD    Assessment & Plan    1  CAD  Plan for CABG Friday  Contnue heparin and NTG  2  HL  LDL 126  Trig 273  Statin  Discussed low fat low carb diet  ON statin    3  Chronic systolic CHF   LVEF 45 to 50%  Medical Rx after CABG  Volume status OK    Signed, Dietrich Pates, MD  06/13/2016, 8:19 AM

## 2016-06-13 NOTE — Progress Notes (Signed)
VASCULAR LAB PRELIMINARY   Pre-op Cardiac Surgery  Carotid Findings:   Findings are consistent with a 1-39 percent stenosis involving the right internal carotid artery and the left internal carotid artery. The vertebral arteries demonstrate antegrade flow.  Upper Extremity Right Left  Brachial Pressures 113  Triphasic 125  Triphasic  Radial Waveforms Triphasic Triphasic  Ulnar Waveforms Triphasic Triphasic  Palmar Arch (Allen's Test) Palmar waveforms are obliterated with radial and ulnar compression. Palmar waveforms are obliterated with radial and ulnar compression.    Lower  Extremity Right Left  Dorsalis Pedis 132 151  Posterior Tibial 149 151  Ankle/Brachial Indices 1.19 1.21   Findings:   Right ABI of 1.19 and left ABI of 1.21 are suggestive of arterial flow within normal limits at rest.   Elsie Stain, RVT 06/13/2016, 11:57 AM

## 2016-06-13 NOTE — Care Management Note (Signed)
Case Management Note  Patient Details  Name: [name] MRN: [id number] Date of Birth: 11/17/1955  Subjective/Objective:         CAD, CHF          Action/Plan: Discharge Planning: NCM spoke to pt and wife, [name] at bedside. Pt states he goes to [place]. Faxed H&P and progress note to [facility]. Scheduled surgery Friday, 4/6 for CABG. Will continue to follow for dc needs.    PCP [name] MD    Expected Discharge Date:                  Expected Discharge Plan:  Home/Self Care  In-House Referral:  NA  Discharge planning Services  CM Consult  Post Acute Care Choice:  NA Choice offered to:  NA  DME Arranged:  N/A DME Agency:  NA  HH Arranged:  NA HH Agency:     Status of Service:  In process, will continue to follow  If discussed at Long Length of Stay Meetings, dates discussed:    Additional Comments:  Elliot Cousin, RN 06/13/2016, 3:31 PM

## 2016-06-13 NOTE — Progress Notes (Signed)
CARDIAC REHAB PHASE I   PRE:  Rate/Rhythm: 81 SR  BP:  Supine:   Sitting: left arm 78/51, 97/66 right arm  Standing:    SaO2: 98%RA  MODE:  Ambulation: 550 ft   POST:  Rate/Rhythm: 94 SR  BP:  Supine:   Sitting: 114/79  Standing:    SaO2: 98%RA 1007-1032 Pt walked 550 ft with steady gait. No CP or dizziness. Pt was having some lower back pain that improved with walk. Re enforced walking and IS post op for recovery. Plans to watch pre op video today. BP improved with walk. RN stated pt had gotten morning meds recently. Briefly discussed CRP 2 and that [facility] has a program.   Luetta Nutting, RN BSN  06/13/2016 10:28 AM

## 2016-06-13 NOTE — Progress Notes (Signed)
ANTICOAGULATION CONSULT NOTE  Pharmacy Consult for Heparin Indication: chest pain/ACS, 3V CAD  No Known Allergies  Patient Measurements: Height:  (190.5 cm) Weight: 281 lb 12.8 oz (127.8 kg) IBW/kg (Calculated) : 84.5 Heparin Dosing Weight: 110 kg  Vital Signs: Temp: 98.2 F (36.8 C) (04/04 0525) Temp Source: Oral (04/04 0525) BP: 129/93 (04/04 0852) Pulse Rate: 83 (04/04 0852)  Labs:  Recent Labs  06/11/16 0319 06/12/16 0429 06/12/16 0823 06/13/16 0453  HGB 12.3* 13.2  --  13.6  HCT 38.4* 40.4  --  42.3  PLT 184 185  --  216  HEPARINUNFRC 0.45  --  0.48 0.38    Estimated Creatinine Clearance: 116.1 mL/min (by C-G formula based on SCr of 0.95 mg/dL).  Assessment:  61 yr old male admitted on 3/31 with chest pain/NSTEMI.  Heparin level therapeutic this morning on 1750 units/hr.     s/p cardiac cath on 4/2, 3V CAD. For CABG on 4/6 after Brlinta washout. Last Brilinta dose 4/2 am.  Goal of Therapy:  Heparin level 0.3-0.7 units/ml Monitor platelets by anticoagulation protocol: Yes   Plan:   Continue heparin drip at 1750 units/hr  Continue daily heparin level and CBC   Vesta Wheeland, Darl Householder 06/13/2016,10:19 AM

## 2016-06-14 DIAGNOSIS — I214 Non-ST elevation (NSTEMI) myocardial infarction: Secondary | ICD-10-CM

## 2016-06-14 DIAGNOSIS — I25110 Atherosclerotic heart disease of native coronary artery with unstable angina pectoris: Secondary | ICD-10-CM

## 2016-06-14 LAB — CBC
HCT: 42 % (ref 39.0–52.0)
Hemoglobin: 13.5 g/dL (ref 13.0–17.0)
MCH: 29.9 pg (ref 26.0–34.0)
MCHC: 32.1 g/dL (ref 30.0–36.0)
MCV: 92.9 fL (ref 78.0–100.0)
PLATELETS: 222 10*3/uL (ref 150–400)
RBC: 4.52 MIL/uL (ref 4.22–5.81)
RDW: 13.9 % (ref 11.5–15.5)
WBC: 9.6 10*3/uL (ref 4.0–10.5)

## 2016-06-14 LAB — HEPARIN LEVEL (UNFRACTIONATED): Heparin Unfractionated: 0.45 IU/mL (ref 0.30–0.70)

## 2016-06-14 LAB — COMPREHENSIVE METABOLIC PANEL
ALT: 34 U/L (ref 17–63)
AST: 26 U/L (ref 15–41)
Albumin: 3.5 g/dL (ref 3.5–5.0)
Alkaline Phosphatase: 59 U/L (ref 38–126)
Anion gap: 7 (ref 5–15)
BUN: 12 mg/dL (ref 6–20)
CO2: 27 mmol/L (ref 22–32)
Calcium: 8.9 mg/dL (ref 8.9–10.3)
Chloride: 104 mmol/L (ref 101–111)
Creatinine, Ser: 1.02 mg/dL (ref 0.61–1.24)
GFR calc Af Amer: >60 mL/min (ref >60)
GFR calc non Af Amer: >60 mL/min (ref >60)
Glucose, Bld: 106 mg/dL — ABNORMAL HIGH (ref 65–99)
Potassium: 3.9 mmol/L (ref 3.5–5.1)
Sodium: 138 mmol/L (ref 135–145)
Total Bilirubin: 0.7 mg/dL (ref 0.3–1.2)
Total Protein: 6.7 g/dL (ref 6.5–8.1)

## 2016-06-14 LAB — VAS US DOPPLER PRE CABG
LEFT ECA DIAS: -7 cm/s
LEFT VERTEBRAL DIAS: -12 cm/s
Left CCA dist dias: -19 cm/s
Left CCA dist sys: -82 cm/s
Left CCA prox dias: -11 cm/s
Left CCA prox sys: -86 cm/s
Left ICA dist dias: -9 cm/s
Left ICA dist sys: -33 cm/s
Left ICA prox dias: -15 cm/s
Left ICA prox sys: -70 cm/s
RIGHT ECA DIAS: -5 cm/s
RIGHT VERTEBRAL DIAS: -5 cm/s
Right CCA prox dias: -11 cm/s
Right CCA prox sys: -93 cm/s
Right cca dist sys: -40 cm/s

## 2016-06-14 LAB — TYPE AND SCREEN
ABO/RH(D): B POS
Antibody Screen: NEGATIVE

## 2016-06-14 LAB — SURGICAL PCR SCREEN
MRSA, PCR: NEGATIVE
Staphylococcus aureus: NEGATIVE

## 2016-06-14 LAB — ABO/RH: ABO/RH(D): B POS

## 2016-06-14 LAB — PLATELET INHIBITION P2Y12: Platelet Function  P2Y12: 263 [PRU] (ref 194–418)

## 2016-06-14 MED ORDER — NITROGLYCERIN IN D5W 200-5 MCG/ML-% IV SOLN
2.0000 ug/min | INTRAVENOUS | Status: DC
  Filled 2016-06-14: qty 250

## 2016-06-14 MED ORDER — PLASMA-LYTE 148 IV SOLN
INTRAVENOUS | Status: AC
  Administered 2016-06-15: 500 mL
  Filled 2016-06-14 (×2): qty 2.5

## 2016-06-14 MED ORDER — CEFUROXIME SODIUM 1.5 G IJ SOLR
1.5000 g | INTRAMUSCULAR | Status: AC
  Administered 2016-06-15: .75 g via INTRAVENOUS
  Administered 2016-06-15: 1.5 g via INTRAVENOUS
  Filled 2016-06-14 (×2): qty 1.5

## 2016-06-14 MED ORDER — BISACODYL 5 MG PO TBEC
5.0000 mg | DELAYED_RELEASE_TABLET | Freq: Once | ORAL | Status: AC
  Administered 2016-06-14: 5 mg via ORAL
  Filled 2016-06-14: qty 1

## 2016-06-14 MED ORDER — PHENYLEPHRINE HCL 10 MG/ML IJ SOLN
30.0000 ug/min | INTRAMUSCULAR | Status: AC
  Administered 2016-06-15: 25 ug/min via INTRAVENOUS
  Filled 2016-06-14 (×2): qty 2

## 2016-06-14 MED ORDER — TRANEXAMIC ACID (OHS) BOLUS VIA INFUSION
15.0000 mg/kg | INTRAVENOUS | Status: AC
  Administered 2016-06-15: 1915.5 mg via INTRAVENOUS
  Filled 2016-06-14 (×2): qty 1916

## 2016-06-14 MED ORDER — DOPAMINE-DEXTROSE 3.2-5 MG/ML-% IV SOLN
0.0000 ug/kg/min | INTRAVENOUS | Status: AC
  Administered 2016-06-15: 5 ug/kg/min via INTRAVENOUS
  Filled 2016-06-14: qty 250

## 2016-06-14 MED ORDER — CYCLOBENZAPRINE HCL 10 MG PO TABS
10.0000 mg | ORAL_TABLET | Freq: Three times a day (TID) | ORAL | Status: DC | PRN
  Administered 2016-06-14 – 2016-06-18 (×3): 10 mg via ORAL
  Filled 2016-06-14 (×3): qty 1

## 2016-06-14 MED ORDER — DEXTROSE 5 % IV SOLN
750.0000 mg | INTRAVENOUS | Status: DC
  Filled 2016-06-14 (×2): qty 750

## 2016-06-14 MED ORDER — DEXMEDETOMIDINE HCL IN NACL 400 MCG/100ML IV SOLN
0.1000 ug/kg/h | INTRAVENOUS | Status: AC
  Administered 2016-06-15: .2 ug/kg/h via INTRAVENOUS
  Filled 2016-06-14 (×2): qty 100

## 2016-06-14 MED ORDER — INSULIN REGULAR HUMAN 100 UNIT/ML IJ SOLN
INTRAMUSCULAR | Status: AC
  Administered 2016-06-15: 1 [IU]/h via INTRAVENOUS
  Filled 2016-06-14 (×2): qty 2.5

## 2016-06-14 MED ORDER — TRANEXAMIC ACID (OHS) PUMP PRIME SOLUTION
2.0000 mg/kg | INTRAVENOUS | Status: DC
  Filled 2016-06-14 (×2): qty 2.55

## 2016-06-14 MED ORDER — CHLORHEXIDINE GLUCONATE CLOTH 2 % EX PADS
6.0000 | MEDICATED_PAD | Freq: Once | CUTANEOUS | Status: AC
  Administered 2016-06-14: 6 via TOPICAL

## 2016-06-14 MED ORDER — TRANEXAMIC ACID 1000 MG/10ML IV SOLN
1.5000 mg/kg/h | INTRAVENOUS | Status: AC
  Administered 2016-06-15: 1.5 mg/kg/h via INTRAVENOUS
  Filled 2016-06-14 (×2): qty 25

## 2016-06-14 MED ORDER — CHLORHEXIDINE GLUCONATE CLOTH 2 % EX PADS
6.0000 | MEDICATED_PAD | Freq: Once | CUTANEOUS | Status: AC
  Administered 2016-06-15: 6 via TOPICAL

## 2016-06-14 MED ORDER — POTASSIUM CHLORIDE 2 MEQ/ML IV SOLN
80.0000 meq | INTRAVENOUS | Status: DC
  Filled 2016-06-14 (×2): qty 40

## 2016-06-14 MED ORDER — EPINEPHRINE PF 1 MG/ML IJ SOLN
0.0000 ug/min | INTRAMUSCULAR | Status: DC
  Filled 2016-06-14 (×2): qty 4

## 2016-06-14 MED ORDER — MAGNESIUM SULFATE 50 % IJ SOLN
40.0000 meq | INTRAMUSCULAR | Status: DC
  Filled 2016-06-14 (×2): qty 10

## 2016-06-14 MED ORDER — METOPROLOL TARTRATE 12.5 MG HALF TABLET
12.5000 mg | ORAL_TABLET | Freq: Once | ORAL | Status: AC
  Administered 2016-06-15: 12.5 mg via ORAL
  Filled 2016-06-14: qty 1

## 2016-06-14 MED ORDER — TEMAZEPAM 15 MG PO CAPS: 15.0000 mg | ORAL_CAPSULE | Freq: Once | ORAL | Status: DC | PRN

## 2016-06-14 MED ORDER — HEPARIN SODIUM (PORCINE) 1000 UNIT/ML IJ SOLN
INTRAMUSCULAR | Status: DC
  Filled 2016-06-14 (×2): qty 30

## 2016-06-14 MED ORDER — VANCOMYCIN HCL 10 G IV SOLR
1500.0000 mg | INTRAVENOUS | Status: AC
  Administered 2016-06-15: 1500 mg via INTRAVENOUS
  Filled 2016-06-14 (×2): qty 1500

## 2016-06-14 MED ORDER — CHLORHEXIDINE GLUCONATE 0.12 % MT SOLN
15.0000 mL | Freq: Once | OROMUCOSAL | Status: AC
  Administered 2016-06-15: 15 mL via OROMUCOSAL
  Filled 2016-06-14: qty 15

## 2016-06-14 NOTE — Progress Notes (Signed)
Patient ID: [name], male   DOB: January 24, 1956, 61 y.o.   MRN: [id number]      [address]       [place] [address]             [phone]                 3 Days Post-Op Procedure(s) (LRB): Left Heart Cath and Coronary Angiography (N/A)  LOS: 5 days   Subjective: No chest pain, up to chair , feels well on ntg and heparin   Objective: Vital signs in last 24 hours: Patient Vitals for the past 24 hrs:  BP Temp Temp src Pulse Resp SpO2 Weight  06/14/16 1453 (!) 119/58 97.3 F (36.3 C) Oral 83 18 98 % -  06/14/16 1027 110/61 - - 95 - - -  06/14/16 0621 115/76 98.1 F (36.7 C) Oral 80 - 99 % 281 lb 9.6 oz (127.7 kg)  06/13/16 2110 103/81 98 F (36.7 C) Oral 98 - 98 % -    Filed Weights   06/11/16 0608 06/13/16 0525 06/14/16 0621  Weight: 286 lb (129.7 kg) 281 lb 12.8 oz (127.8 kg) 281 lb 9.6 oz (127.7 kg)    Hemodynamic parameters for last 24 hours:    Intake/Output from previous day: 04/04 0701 - 04/05 0700 In: 1373 [P.O.:720; I.V.:533] Out: 1850 [Urine:1850] Intake/Output this shift: Total I/O In: 360 [P.O.:360] Out: -   Scheduled Meds: . aspirin EC  81 mg Oral Daily  . atorvastatin  80 mg Oral q1800  . bisacodyl  5 mg Oral Once  . [START ON 06/15/2016] cefUROXime (ZINACEF)  IV  1.5 g Intravenous To OR  . [START ON 06/15/2016] cefUROXime (ZINACEF)  IV  750 mg Intravenous To OR  . [START ON 06/15/2016] chlorhexidine  15 mL Mouth/Throat Once  . Chlorhexidine Gluconate Cloth  6 each Topical Once   And  . [START ON 06/15/2016] Chlorhexidine Gluconate Cloth  6 each Topical Once  . [START ON 06/15/2016] dexmedetomidine  0.1-0.7 mcg/kg/hr Intravenous To OR  . [START ON 06/15/2016] DOPamine  0-10 mcg/kg/min Intravenous To OR  . [START ON 06/15/2016] epinephrine  0-10 mcg/min Intravenous To OR  . escitalopram  10 mg Oral Daily  . [START ON 06/15/2016] heparin-papaverine-plasmalyte irrigation   Irrigation To OR  . [START ON 06/15/2016] heparin 30,000 units/NS 1000 mL  solution for CELLSAVER   Other To OR  . [START ON 06/15/2016] insulin (NOVOLIN-R) infusion   Intravenous To OR  . [START ON 06/15/2016] magnesium sulfate  40 mEq Other To OR  . [START ON 06/15/2016] metoprolol tartrate  12.5 mg Oral Once  . metoprolol tartrate  25 mg Oral BID  . [START ON 06/15/2016] nitroGLYCERIN  2-200 mcg/min Intravenous To OR  . [START ON 06/15/2016] phenylephrine /238mL NS (0.08mg /ml) infusion  30-200 mcg/min Intravenous To OR  . [START ON 06/15/2016] potassium chloride  80 mEq Other To OR  . sodium chloride flush  3 mL Intravenous Q12H  . [START ON 06/15/2016] tranexamic acid (CYKLOKAPRON) infusion ([facility])  1.5 mg/kg/hr Intravenous To OR  . [START ON 06/15/2016] tranexamic acid  15 mg/kg Intravenous To OR  . [START ON 06/15/2016] tranexamic acid  2 mg/kg Intracatheter To OR  . [START ON 06/15/2016] vancomycin  1,500 mg Intravenous To OR   Continuous Infusions: . sodium chloride Stopped (06/11/16 1136)  . heparin 1,750 Units/hr (06/14/16 2130)  . nitroGLYCERIN 10 mcg/min (06/12/16 1700)   PRN Meds:.sodium chloride, acetaminophen, ALPRAZolam,  cyclobenzaprine, ondansetron (ZOFRAN) IV, sodium chloride flush, temazepam  General appearance: alert and cooperative Neurologic: intact Heart: regular rate and rhythm, S1, S2 normal, no murmur, click, rub or gallop Lungs: clear to auscultation bilaterally Abdomen: soft, non-tender; bowel sounds normal; no masses,  no organomegaly Extremities: extremities normal, atraumatic, no cyanosis or edema and Homans sign is negative, no sign of DVT Wound: right wrist cath site ok  Lab Results: CBC:  Recent Labs  06/13/16 0453 06/14/16 0417  WBC 9.0 9.6  HGB 13.6 13.5  HCT 42.3 42.0  PLT 216 222   BMET:   Recent Labs  06/14/16 0417  NA 138  K 3.9  CL 104  CO2 27  GLUCOSE 106*  BUN 12  CREATININE 1.02  CALCIUM 8.9    PT/INR: No results for input(s): LABPROT, INR in the last 72 hours.   Radiology No results  found.   Assessment/Plan: S/P Procedure(s) (LRB): Left Heart Cath and Coronary Angiography (N/A) Mobilize Chest xray done , noted, pfts ok P2Y12- 263 - Plan  CABG in am  The goals risks and alternatives of the planned surgical procedure Procedure(s): Left Heart Cath and Coronary Angiography (N/A)  have been discussed with the patient in detail. The risks of the procedure including death, infection, stroke, myocardial infarction, bleeding, blood transfusion have all been discussed specifically.  I have quoted [name] a 2% of perioperative mortality and a complication rate as high as 40 %. The patient's questions have been answered.[name] is willing  to proceed with the planned procedure.  [name] MD 06/14/2016 6:31 PM    Patient ID: [name], male   DOB: Jan 30, 1956, 61 y.o.   MRN: [id number]

## 2016-06-14 NOTE — Progress Notes (Signed)
ANTICOAGULATION CONSULT NOTE  Pharmacy Consult for Heparin Indication: chest pain/ACS, 3V CAD  Allergies  Allergen Reactions  . No Known Allergies     Patient Measurements: Height:  (190.5 cm) Weight: 281 lb 9.6 oz (127.7 kg) IBW/kg (Calculated) : 84.5 Heparin Dosing Weight: 110 kg  Vital Signs: Temp: 98.1 F (36.7 C) (04/05 0621) Temp Source: Oral (04/05 0621) BP: 115/76 (04/05 0621) Pulse Rate: 80 (04/05 0621)   Recent Labs  06/12/16 0429 06/12/16 0823 06/13/16 0453 06/14/16 0417  HGB 13.2  --  13.6 13.5  HCT 40.4  --  42.3 42.0  PLT 185  --  216 222  HEPARINUNFRC  --  0.48 0.38 0.45  CREATININE  --   --   --  1.02     Assessment:  61 yr old male admitted on 3/31 with chest pain/NSTEMI.  Heparin level therapeutic this morning on 1750 units/hr.     s/p cardiac cath on 4/2, 3V CAD. For CABG on 4/6 after Brlinta washout. Last Brilinta dose 4/2 am.   Goal of Therapy:  Heparin level 0.3-0.7 units/ml Monitor platelets by anticoagulation protocol: Yes    Plan:   Continue heparin drip at 1750 units/hr  Continue daily heparin level and CBC   Inara Dike, Darl Householder 06/14/2016,8:44 AM

## 2016-06-14 NOTE — Progress Notes (Signed)
Progress Note  Patient Name: [name] Date of Encounter: 06/14/2016  Primary Cardiologist: Dr. Dulce Sellar [facility])   Subjective   No complaints today. He denies CP. No dyspnea. He did have LBP yesterday which has improved.   Inpatient Medications    Scheduled Meds: . aspirin EC  81 mg Oral Daily  . atorvastatin  80 mg Oral q1800  . escitalopram  10 mg Oral Daily  . furosemide  20 mg Intravenous Daily  . metoprolol tartrate  25 mg Oral BID  . sodium chloride flush  3 mL Intravenous Q12H   Continuous Infusions: . sodium chloride Stopped (06/11/16 1136)  . heparin 1,750 Units/hr (06/14/16 1610)  . nitroGLYCERIN 10 mcg/min (06/12/16 1700)   PRN Meds: sodium chloride, acetaminophen, ALPRAZolam, cyclobenzaprine, ondansetron (ZOFRAN) IV, sodium chloride flush   Vital Signs    Vitals:   06/13/16 0852 06/13/16 1552 06/13/16 2110 06/14/16 0621  BP: (!) 129/93 119/79 103/81 115/76  Pulse: 83 85 98 80  Resp:  19    Temp:  97.8 F (36.6 C) 98 F (36.7 C) 98.1 F (36.7 C)  TempSrc:  Oral Oral Oral  SpO2:  94% 98% 99%  Weight:    281 lb 9.6 oz (127.7 kg)  Height:        Intake/Output Summary (Last 24 hours) at 06/14/16 0646 Last data filed at 06/14/16 0622  Gross per 24 hour  Intake             1373 ml  Output             1850 ml  Net             -477 ml   Filed Weights   06/11/16 0608 06/13/16 0525 06/14/16 0621  Weight: 286 lb (129.7 kg) 281 lb 12.8 oz (127.8 kg) 281 lb 9.6 oz (127.7 kg)    Telemetry    NSR - Personally Reviewed  ECG    SR w/ PACs 06/10/16 - Personally Reviewed  Physical Exam   GEN: No acute distress. Moderately obese    Neck: No JVD Cardiac: RRR, no murmurs, rubs, or gallops.  Respiratory: Clear to auscultation bilaterally. GI: Soft, nontender, non-distended  MS: No edema; No deformity. Neuro:  Nonfocal  Psych: Normal affect   Labs    Chemistry Recent Labs Lab 06/09/16 0246 06/14/16 0417  NA 139 138  K 4.0 3.9  CL 106 104    CO2 24 27  GLUCOSE 120* 106*  BUN 17 12  CREATININE 0.95 1.02  CALCIUM 8.6* 8.9  PROT 6.5 6.7  ALBUMIN 3.5 3.5  AST 32 26  ALT 17 34  ALKPHOS 46 59  BILITOT 0.5 0.7  GFRNONAA >60 >60  GFRAA >60 >60  ANIONGAP 9 7     Hematology Recent Labs Lab 06/12/16 0429 06/13/16 0453 06/14/16 0417  WBC 8.5 9.0 9.6  RBC 4.37 4.54 4.52  HGB 13.2 13.6 13.5  HCT 40.4 42.3 42.0  MCV 92.4 93.2 92.9  MCH 30.2 30.0 29.9  MCHC 32.7 32.2 32.1  RDW 13.7 14.2 13.9  PLT 185 216 222    Cardiac Enzymes Recent Labs Lab 06/09/16 0246 06/09/16 1005 06/09/16 1615 06/09/16 2157  TROPONINI 2.65* 2.07* 3.03* 2.42*   No results for input(s): TROPIPOC in the last 168 hours.   BNP Recent Labs Lab 06/09/16 0246  BNP 81.3     DDimer No results for input(s): DDIMER in the last 168 hours.   Radiology    Dg  Chest 2 View  Result Date: 06/12/2016 CLINICAL DATA:  Preoperative examination prior to CABG. History of previous MI, obesity. EXAM: CHEST  2 VIEW COMPARISON:  Portable chest x-ray of June 08, 2016 FINDINGS: The lungs are adequately inflated and clear. The heart is top-normal in size. The pulmonary vascularity is normal. There is calcification in the wall of the aortic arch. The mediastinum is normal in width. There is no pleural effusion. The bony thorax is unremarkable. IMPRESSION: There is no acute cardiopulmonary abnormality. Thoracic aortic atherosclerosis. Electronically Signed   By: David  [place] M.D.   On: 06/12/2016 07:21    Cardiac Studies   Procedures   Left Heart Cath and Coronary Angiography 06/11/16  Conclusion   Conclusions: 1. 3-vessel coronary artery disease, including 80% ostial and 90% mid LAD lesions. The mid LAD stenosis is a complex lesion involving ostium of a moderate-caliber first diagonal ([place] 1,1,1). 60% moderate-caliber OM1 as well as 60-70% proximal and 40% mid RCA lesions are also present. 2. Mildly reduced left ventricular contraction with apical anterior  and apical hypokinesis. 3. Moderately elevated left ventricular filling pressure.     Patient Profile     61 y.o. male who presented w/ CP and NSTEMI. Found to have 3 V CAD. Awaiting CABG.     Assessment & Plan    1. Multivessel CAD: angiographic details of LHC 06/11/16 outlined above. Plan is for CABG tomorrow w/ Dr. Tyrone Sage. He is CP free. No dyspnea. Continue IV heparin, atorvastatin and metoprolol.    2. Chronic Systolic HF: LVEF 45-50%. Plan medical Rx after CABG. Volume status is stable.     3. HLD: LDL is high at 126. Trig also high at 273. He is on high dose statin therapy with Lipitor. Recommendations are for low fat/ low carb diet.   Signed, Robbie Lis, PA-C  06/14/2016, 6:46 AM    Pt seen and examined  Agree with findings as noted above by B Simmons  Pt  Remains CP free  Does have back pain from laying in bed ON exam:  LUngs CTA  Card RRR  NO S3  Ext without edema   Plan for CABG tomorrow.   WIll increasse dose of prn Flexeril  For back pain   Dietrich Pates

## 2016-06-14 NOTE — Progress Notes (Signed)
CARDIAC REHAB PHASE I   PRE:  Rate/Rhythm: 88 SR  BP:  Sitting: 115/81        SaO2: 96 RA  MODE:  Ambulation: 300 ft   POST:  Rate/Rhythm: 99 SR  BP:  Sitting: 111/81         SaO2: 99 RA  Pt c/o back pain, had some difficulty getting oob and standing. Pt states pain is 5/10. Pt ambulated 300 ft on RA, IV, hand held assist, mostly steady gait, tolerated fairly well. Pt c/o DOE, back pain (worse with activity), denies any other complaints, declined rest stop. Pt very anxious about upcoming surgery, emotional support given to pt. Pt to recliner after walk, call bell within reach. Will follow post-op.    [id number] Joylene Grapes, RN, BSN 06/14/2016 10:11 AM

## 2016-06-15 ENCOUNTER — Encounter (HOSPITAL_COMMUNITY): Payer: Self-pay | Admitting: Anesthesiology

## 2016-06-15 ENCOUNTER — Inpatient Hospital Stay (HOSPITAL_COMMUNITY): Payer: Non-veteran care | Admitting: Anesthesiology

## 2016-06-15 ENCOUNTER — Inpatient Hospital Stay (HOSPITAL_COMMUNITY): Payer: Non-veteran care

## 2016-06-15 ENCOUNTER — Inpatient Hospital Stay (HOSPITAL_COMMUNITY)
Admission: AD | Disposition: A | Discharge status: Home / Self Care | Payer: Self-pay | Source: Other Acute Inpatient Hospital | Attending: Cardiology

## 2016-06-15 DIAGNOSIS — I2510 Atherosclerotic heart disease of native coronary artery without angina pectoris: Secondary | ICD-10-CM

## 2016-06-15 HISTORY — DX: Atherosclerotic heart disease of native coronary artery without angina pectoris: I25.10

## 2016-06-15 HISTORY — PX: TEE WITHOUT CARDIOVERSION: SHX5443

## 2016-06-15 HISTORY — PX: CORONARY ARTERY BYPASS GRAFT: SHX141

## 2016-06-15 LAB — POCT I-STAT, CHEM 8
BUN: 17 mg/dL (ref 6–20)
BUN: 15 mg/dL (ref 6–20)
BUN: 16 mg/dL (ref 6–20)
BUN: 16 mg/dL (ref 6–20)
BUN: 15 mg/dL (ref 6–20)
BUN: 14 mg/dL (ref 6–20)
Calcium, Ion: 1.3 mmol/L (ref 1.15–1.40)
Calcium, Ion: 1.08 mmol/L — ABNORMAL LOW (ref 1.15–1.40)
Calcium, Ion: 1.2 mmol/L (ref 1.15–1.40)
Calcium, Ion: 1.09 mmol/L — ABNORMAL LOW (ref 1.15–1.40)
Calcium, Ion: 1.15 mmol/L (ref 1.15–1.40)
Calcium, Ion: 1.28 mmol/L (ref 1.15–1.40)
Chloride: 104 mmol/L (ref 101–111)
Chloride: 105 mmol/L (ref 101–111)
Chloride: 99 mmol/L — ABNORMAL LOW (ref 101–111)
Chloride: 102 mmol/L (ref 101–111)
Chloride: 102 mmol/L (ref 101–111)
Chloride: 108 mmol/L (ref 101–111)
Creatinine, Ser: 1 mg/dL (ref 0.61–1.24)
Creatinine, Ser: 0.8 mg/dL (ref 0.61–1.24)
Creatinine, Ser: 0.9 mg/dL (ref 0.61–1.24)
Creatinine, Ser: 0.8 mg/dL (ref 0.61–1.24)
Creatinine, Ser: 0.9 mg/dL (ref 0.61–1.24)
Creatinine, Ser: 1 mg/dL (ref 0.61–1.24)
Glucose, Bld: 100 mg/dL — ABNORMAL HIGH (ref 65–99)
Glucose, Bld: 131 mg/dL — ABNORMAL HIGH (ref 65–99)
Glucose, Bld: 147 mg/dL — ABNORMAL HIGH (ref 65–99)
Glucose, Bld: 178 mg/dL — ABNORMAL HIGH (ref 65–99)
Glucose, Bld: 175 mg/dL — ABNORMAL HIGH (ref 65–99)
Glucose, Bld: 128 mg/dL — ABNORMAL HIGH (ref 65–99)
HCT: 40 % (ref 39.0–52.0)
HCT: 31 % — ABNORMAL LOW (ref 39.0–52.0)
HCT: 38 % — ABNORMAL LOW (ref 39.0–52.0)
HCT: 35 % — ABNORMAL LOW (ref 39.0–52.0)
HCT: 36 % — ABNORMAL LOW (ref 39.0–52.0)
HCT: 34 % — ABNORMAL LOW (ref 39.0–52.0)
Hemoglobin: 13.6 g/dL (ref 13.0–17.0)
Hemoglobin: 10.5 g/dL — ABNORMAL LOW (ref 13.0–17.0)
Hemoglobin: 12.9 g/dL — ABNORMAL LOW (ref 13.0–17.0)
Hemoglobin: 11.9 g/dL — ABNORMAL LOW (ref 13.0–17.0)
Hemoglobin: 12.2 g/dL — ABNORMAL LOW (ref 13.0–17.0)
Hemoglobin: 11.6 g/dL — ABNORMAL LOW (ref 13.0–17.0)
Potassium: 4.1 mmol/L (ref 3.5–5.1)
Potassium: 4.5 mmol/L (ref 3.5–5.1)
Potassium: 5.4 mmol/L — ABNORMAL HIGH (ref 3.5–5.1)
Potassium: 6.2 mmol/L — ABNORMAL HIGH (ref 3.5–5.1)
Potassium: 4.5 mmol/L (ref 3.5–5.1)
Potassium: 4.2 mmol/L (ref 3.5–5.1)
Sodium: 140 mmol/L (ref 135–145)
Sodium: 133 mmol/L — ABNORMAL LOW (ref 135–145)
Sodium: 138 mmol/L (ref 135–145)
Sodium: 135 mmol/L (ref 135–145)
Sodium: 136 mmol/L (ref 135–145)
Sodium: 136 mmol/L (ref 135–145)
TCO2: 26 mmol/L (ref 0–100)
TCO2: 25 mmol/L (ref 0–100)
TCO2: 26 mmol/L (ref 0–100)
TCO2: 26 mmol/L (ref 0–100)
TCO2: 24 mmol/L (ref 0–100)
TCO2: 25 mmol/L (ref 0–100)

## 2016-06-15 LAB — POCT I-STAT 3, ART BLOOD GAS (G3+)
Acid-Base Excess: 1 mmol/L (ref 0.0–2.0)
Acid-base deficit: 2 mmol/L (ref 0.0–2.0)
Acid-base deficit: 3 mmol/L — ABNORMAL HIGH (ref 0.0–2.0)
Acid-base deficit: 2 mmol/L (ref 0.0–2.0)
Bicarbonate: 25.1 mmol/L (ref 20.0–28.0)
Bicarbonate: 24 mmol/L (ref 20.0–28.0)
Bicarbonate: 23.1 mmol/L (ref 20.0–28.0)
Bicarbonate: 21.8 mmol/L (ref 20.0–28.0)
O2 Saturation: 100 %
O2 Saturation: 100 %
O2 Saturation: 95 %
O2 Saturation: 96 %
Patient temperature: 35.4
Patient temperature: 37
Patient temperature: 36
Patient temperature: 37.6
TCO2: 26 mmol/L (ref 0–100)
TCO2: 25 mmol/L (ref 0–100)
TCO2: 24 mmol/L (ref 0–100)
TCO2: 23 mmol/L (ref 0–100)
pCO2 arterial: 34.3 mmHg (ref 32.0–48.0)
pCO2 arterial: 46.6 mmHg (ref 32.0–48.0)
pCO2 arterial: 43.6 mmHg (ref 32.0–48.0)
pCO2 arterial: 33.5 mmHg (ref 32.0–48.0)
pH, Arterial: 7.465 — ABNORMAL HIGH (ref 7.350–7.450)
pH, Arterial: 7.32 — ABNORMAL LOW (ref 7.350–7.450)
pH, Arterial: 7.327 — ABNORMAL LOW (ref 7.350–7.450)
pH, Arterial: 7.424 (ref 7.350–7.450)
pO2, Arterial: 359 mmHg — ABNORMAL HIGH (ref 83.0–108.0)
pO2, Arterial: 222 mmHg — ABNORMAL HIGH (ref 83.0–108.0)
pO2, Arterial: 76 mmHg — ABNORMAL LOW (ref 83.0–108.0)
pO2, Arterial: 80 mmHg — ABNORMAL LOW (ref 83.0–108.0)

## 2016-06-15 LAB — POCT I-STAT 3, VENOUS BLOOD GAS (G3P V)
Acid-base deficit: 2 mmol/L (ref 0.0–2.0)
Acid-base deficit: 2 mmol/L (ref 0.0–2.0)
Acid-base deficit: 3 mmol/L — ABNORMAL HIGH (ref 0.0–2.0)
Bicarbonate: 25.1 mmol/L (ref 20.0–28.0)
Bicarbonate: 23.4 mmol/L (ref 20.0–28.0)
Bicarbonate: 23.4 mmol/L (ref 20.0–28.0)
O2 Saturation: 75 %
O2 Saturation: 74 %
O2 Saturation: 72 %
Patient temperature: 36.3
Patient temperature: 35.4
Patient temperature: 37
TCO2: 27 mmol/L (ref 0–100)
TCO2: 25 mmol/L (ref 0–100)
TCO2: 25 mmol/L (ref 0–100)
pCO2, Ven: 52.2 mmHg (ref 44.0–60.0)
pCO2, Ven: 37.6 mmHg — ABNORMAL LOW (ref 44.0–60.0)
pCO2, Ven: 47.7 mmHg (ref 44.0–60.0)
pH, Ven: 7.286 (ref 7.250–7.430)
pH, Ven: 7.395 (ref 7.250–7.430)
pH, Ven: 7.299 (ref 7.250–7.430)
pO2, Ven: 44 mmHg (ref 32.0–45.0)
pO2, Ven: 36 mmHg (ref 32.0–45.0)
pO2, Ven: 42 mmHg (ref 32.0–45.0)

## 2016-06-15 LAB — BLOOD GAS, ARTERIAL
Acid-base deficit: 1 mmol/L (ref 0.0–2.0)
Bicarbonate: 23 mmol/L (ref 20.0–28.0)
Drawn by: 270271
FIO2: 21
O2 Saturation: 96.5 %
Patient temperature: 98.6
pCO2 arterial: 37.3 mmHg (ref 32.0–48.0)
pH, Arterial: 7.407 (ref 7.350–7.450)
pO2, Arterial: 88.3 mmHg (ref 83.0–108.0)

## 2016-06-15 LAB — URINALYSIS, ROUTINE W REFLEX MICROSCOPIC
Bilirubin Urine: NEGATIVE
Glucose, UA: NEGATIVE mg/dL
Hgb urine dipstick: NEGATIVE
Ketones, ur: NEGATIVE mg/dL
Nitrite: NEGATIVE
Protein, ur: NEGATIVE mg/dL
Specific Gravity, Urine: 1.012 (ref 1.005–1.030)
Squamous Epithelial / LPF: NONE SEEN
pH: 5 (ref 5.0–8.0)

## 2016-06-15 LAB — CBC
HCT: 37.1 % — ABNORMAL LOW (ref 39.0–52.0)
HEMATOCRIT: 41.1 % (ref 39.0–52.0)
HEMOGLOBIN: 13.3 g/dL (ref 13.0–17.0)
HEMOGLOBIN: 12.1 g/dL — AB (ref 13.0–17.0)
MCH: 30.2 pg (ref 26.0–34.0)
MCH: 30 pg (ref 26.0–34.0)
MCHC: 32.4 g/dL (ref 30.0–36.0)
MCHC: 32.6 g/dL (ref 30.0–36.0)
MCV: 93.2 fL (ref 78.0–100.0)
MCV: 91.8 fL (ref 78.0–100.0)
Platelets: 218 10*3/uL (ref 150–400)
Platelets: 122 10*3/uL — ABNORMAL LOW (ref 150–400)
RBC: 4.41 MIL/uL (ref 4.22–5.81)
RBC: 4.04 MIL/uL — ABNORMAL LOW (ref 4.22–5.81)
RDW: 14 % (ref 11.5–15.5)
RDW: 13.6 % (ref 11.5–15.5)
WBC: 9.3 10*3/uL (ref 4.0–10.5)
WBC: 10.7 10*3/uL — AB (ref 4.0–10.5)

## 2016-06-15 LAB — PROTIME-INR
INR: 1.19
Prothrombin Time: 15.2 seconds (ref 11.4–15.2)

## 2016-06-15 LAB — GLUCOSE, CAPILLARY
Glucose-Capillary: 108 mg/dL — ABNORMAL HIGH (ref 65–99)
Glucose-Capillary: 123 mg/dL — ABNORMAL HIGH (ref 65–99)
Glucose-Capillary: 129 mg/dL — ABNORMAL HIGH (ref 65–99)
Glucose-Capillary: 123 mg/dL — ABNORMAL HIGH (ref 65–99)
Glucose-Capillary: 122 mg/dL — ABNORMAL HIGH (ref 65–99)
Glucose-Capillary: 118 mg/dL — ABNORMAL HIGH (ref 65–99)
Glucose-Capillary: 141 mg/dL — ABNORMAL HIGH (ref 65–99)

## 2016-06-15 LAB — POCT I-STAT 4, (NA,K, GLUC, HGB,HCT)
Glucose, Bld: 108 mg/dL — ABNORMAL HIGH (ref 65–99)
HCT: 33 % — ABNORMAL LOW (ref 39.0–52.0)
Hemoglobin: 11.2 g/dL — ABNORMAL LOW (ref 13.0–17.0)
Potassium: 3.8 mmol/L (ref 3.5–5.1)
Sodium: 141 mmol/L (ref 135–145)

## 2016-06-15 LAB — BASIC METABOLIC PANEL
Anion gap: 10 (ref 5–15)
BUN: 14 mg/dL (ref 6–20)
CO2: 26 mmol/L (ref 22–32)
Calcium: 9.2 mg/dL (ref 8.9–10.3)
Chloride: 101 mmol/L (ref 101–111)
Creatinine, Ser: 1.16 mg/dL (ref 0.61–1.24)
GFR calc Af Amer: >60 mL/min (ref >60)
GFR calc non Af Amer: >60 mL/min (ref >60)
Glucose, Bld: 92 mg/dL (ref 65–99)
Potassium: 4.2 mmol/L (ref 3.5–5.1)
Sodium: 137 mmol/L (ref 135–145)

## 2016-06-15 LAB — CREATININE, SERUM
Creatinine, Ser: 0.98 mg/dL (ref 0.61–1.24)
GFR calc Af Amer: >60 mL/min (ref >60)
GFR calc non Af Amer: >60 mL/min (ref >60)

## 2016-06-15 LAB — PLATELET COUNT: Platelets: 204 10*3/uL (ref 150–400)

## 2016-06-15 LAB — HEMOGLOBIN AND HEMATOCRIT, BLOOD
HCT: 34.6 % — ABNORMAL LOW (ref 39.0–52.0)
Hemoglobin: 11.1 g/dL — ABNORMAL LOW (ref 13.0–17.0)

## 2016-06-15 LAB — HEPARIN LEVEL (UNFRACTIONATED): Heparin Unfractionated: 0.39 IU/mL (ref 0.30–0.70)

## 2016-06-15 LAB — APTT: aPTT: 36 seconds (ref 24–36)

## 2016-06-15 LAB — MAGNESIUM: Magnesium: 2.5 mg/dL — ABNORMAL HIGH (ref 1.7–2.4)

## 2016-06-15 SURGERY — CORONARY ARTERY BYPASS GRAFTING (CABG)
Anesthesia: General | Site: Chest

## 2016-06-15 MED ORDER — LACTATED RINGERS IV SOLN
INTRAVENOUS | Status: DC | PRN
  Administered 2016-06-15: 07:00:00 via INTRAVENOUS

## 2016-06-15 MED ORDER — PROTAMINE SULFATE 10 MG/ML IV SOLN
INTRAVENOUS | Status: DC | PRN
  Administered 2016-06-15: 400 mg via INTRAVENOUS

## 2016-06-15 MED ORDER — INSULIN REGULAR BOLUS VIA INFUSION
0.0000 [IU] | Freq: Three times a day (TID) | INTRAVENOUS | Status: DC
  Administered 2016-06-16: 3 [IU] via INTRAVENOUS
  Administered 2016-06-16: 4 [IU] via INTRAVENOUS
  Filled 2016-06-15: qty 10

## 2016-06-15 MED ORDER — SODIUM CHLORIDE 0.9 % IV SOLN
0.0000 ug/kg/h | INTRAVENOUS | Status: DC
  Administered 2016-06-15: 0.5 ug/kg/h via INTRAVENOUS
  Filled 2016-06-15: qty 2

## 2016-06-15 MED ORDER — ROCURONIUM BROMIDE 50 MG/5ML IV SOSY
PREFILLED_SYRINGE | INTRAVENOUS | Status: AC
  Filled 2016-06-15: qty 10

## 2016-06-15 MED ORDER — OXYCODONE HCL 5 MG PO TABS
5.0000 mg | ORAL_TABLET | ORAL | Status: DC | PRN
  Administered 2016-06-16 – 2016-06-17 (×4): 10 mg via ORAL
  Administered 2016-06-17: 5 mg via ORAL
  Administered 2016-06-18: 10 mg via ORAL
  Filled 2016-06-15: qty 1
  Filled 2016-06-15 (×3): qty 2
  Filled 2016-06-15: qty 1
  Filled 2016-06-15 (×2): qty 2

## 2016-06-15 MED ORDER — MIDAZOLAM HCL 10 MG/2ML IJ SOLN
INTRAMUSCULAR | Status: AC
  Filled 2016-06-15: qty 2

## 2016-06-15 MED ORDER — ACETAMINOPHEN 160 MG/5ML PO SOLN: 1000.0000 mg | Freq: Four times a day (QID) | ORAL | Status: DC

## 2016-06-15 MED ORDER — SODIUM CHLORIDE 0.45 % IV SOLN: INTRAVENOUS | Status: DC | PRN

## 2016-06-15 MED ORDER — PROTAMINE SULFATE 10 MG/ML IV SOLN
INTRAVENOUS | Status: AC
  Filled 2016-06-15: qty 25

## 2016-06-15 MED ORDER — MORPHINE SULFATE (PF) 4 MG/ML IV SOLN
2.0000 mg | INTRAVENOUS | Status: DC | PRN
  Administered 2016-06-15: 4 mg via INTRAVENOUS
  Administered 2016-06-15: 2 mg via INTRAVENOUS
  Administered 2016-06-15 – 2016-06-16 (×6): 4 mg via INTRAVENOUS
  Filled 2016-06-15 (×8): qty 1

## 2016-06-15 MED ORDER — DOCUSATE SODIUM 100 MG PO CAPS
200.0000 mg | ORAL_CAPSULE | Freq: Every day | ORAL | Status: DC
  Administered 2016-06-16 – 2016-06-20 (×5): 200 mg via ORAL
  Filled 2016-06-15 (×5): qty 2

## 2016-06-15 MED ORDER — SODIUM CHLORIDE 0.9% FLUSH: 3.0000 mL | INTRAVENOUS | Status: DC | PRN

## 2016-06-15 MED ORDER — ORAL CARE MOUTH RINSE
15.0000 mL | Freq: Four times a day (QID) | OROMUCOSAL | Status: DC
  Administered 2016-06-16: 15 mL via OROMUCOSAL

## 2016-06-15 MED ORDER — LACTATED RINGERS IV SOLN: INTRAVENOUS | Status: DC

## 2016-06-15 MED ORDER — MORPHINE SULFATE (PF) 4 MG/ML IV SOLN: 1.0000 mg | INTRAVENOUS | Status: AC | PRN

## 2016-06-15 MED ORDER — PROPOFOL 10 MG/ML IV BOLUS
INTRAVENOUS | Status: AC
  Filled 2016-06-15: qty 20

## 2016-06-15 MED ORDER — PANTOPRAZOLE SODIUM 40 MG PO TBEC
40.0000 mg | DELAYED_RELEASE_TABLET | Freq: Every day | ORAL | Status: DC
  Administered 2016-06-17 – 2016-06-20 (×4): 40 mg via ORAL
  Filled 2016-06-15 (×4): qty 1

## 2016-06-15 MED ORDER — HEPARIN SODIUM (PORCINE) 1000 UNIT/ML IJ SOLN
INTRAMUSCULAR | Status: AC
  Filled 2016-06-15: qty 1

## 2016-06-15 MED ORDER — PROPOFOL 10 MG/ML IV BOLUS
INTRAVENOUS | Status: DC | PRN
  Administered 2016-06-15: 90 mg via INTRAVENOUS

## 2016-06-15 MED ORDER — ALBUMIN HUMAN 5 % IV SOLN
INTRAVENOUS | Status: DC | PRN
  Administered 2016-06-15: 13:00:00 via INTRAVENOUS

## 2016-06-15 MED ORDER — CHLORHEXIDINE GLUCONATE 0.12 % MT SOLN
15.0000 mL | OROMUCOSAL | Status: AC
  Administered 2016-06-15: 15 mL via OROMUCOSAL

## 2016-06-15 MED ORDER — VECURONIUM BROMIDE 10 MG IV SOLR
INTRAVENOUS | Status: DC | PRN
  Administered 2016-06-15 (×2): 5 mg via INTRAVENOUS

## 2016-06-15 MED ORDER — ALBUMIN HUMAN 5 % IV SOLN
250.0000 mL | INTRAVENOUS | Status: AC | PRN
  Administered 2016-06-15 – 2016-06-16 (×3): 250 mL via INTRAVENOUS
  Filled 2016-06-15: qty 250

## 2016-06-15 MED ORDER — 0.9 % SODIUM CHLORIDE (POUR BTL) OPTIME
TOPICAL | Status: DC | PRN
  Administered 2016-06-15: 5000 mL

## 2016-06-15 MED ORDER — ROCURONIUM BROMIDE 10 MG/ML (PF) SYRINGE
PREFILLED_SYRINGE | INTRAVENOUS | Status: DC | PRN
  Administered 2016-06-15: 20 mg via INTRAVENOUS
  Administered 2016-06-15 (×2): 100 mg via INTRAVENOUS
  Administered 2016-06-15: 30 mg via INTRAVENOUS

## 2016-06-15 MED ORDER — DEXTROSE 5 % IV SOLN
1.5000 g | Freq: Two times a day (BID) | INTRAVENOUS | Status: AC
  Administered 2016-06-15 – 2016-06-17 (×4): 1.5 g via INTRAVENOUS
  Filled 2016-06-15 (×4): qty 1.5

## 2016-06-15 MED ORDER — LACTATED RINGERS IV SOLN: 500.0000 mL | Freq: Once | INTRAVENOUS | Status: DC | PRN

## 2016-06-15 MED ORDER — BISACODYL 10 MG RE SUPP: 10.0000 mg | Freq: Every day | RECTAL | Status: DC

## 2016-06-15 MED ORDER — ACETAMINOPHEN 650 MG RE SUPP
650.0000 mg | Freq: Once | RECTAL | Status: AC
  Administered 2016-06-15: 650 mg via RECTAL

## 2016-06-15 MED ORDER — DOPAMINE-DEXTROSE 3.2-5 MG/ML-% IV SOLN
0.0000 ug/kg/min | INTRAVENOUS | Status: DC
Start: 2016-06-15 — End: 2016-06-17

## 2016-06-15 MED ORDER — ASPIRIN EC 325 MG PO TBEC
325.0000 mg | DELAYED_RELEASE_TABLET | Freq: Every day | ORAL | Status: DC
  Administered 2016-06-16 – 2016-06-20 (×5): 325 mg via ORAL
  Filled 2016-06-15 (×5): qty 1

## 2016-06-15 MED ORDER — MIDAZOLAM HCL 2 MG/2ML IJ SOLN: 2.0000 mg | INTRAMUSCULAR | Status: DC | PRN

## 2016-06-15 MED ORDER — METOPROLOL TARTRATE 12.5 MG HALF TABLET
12.5000 mg | ORAL_TABLET | Freq: Two times a day (BID) | ORAL | Status: DC
  Administered 2016-06-16 – 2016-06-17 (×4): 12.5 mg via ORAL
  Filled 2016-06-15 (×4): qty 1

## 2016-06-15 MED ORDER — SODIUM CHLORIDE 0.9 % IV SOLN: INTRAVENOUS | Status: DC

## 2016-06-15 MED ORDER — METOCLOPRAMIDE HCL 5 MG/ML IJ SOLN
10.0000 mg | Freq: Four times a day (QID) | INTRAMUSCULAR | Status: DC
  Administered 2016-06-15 – 2016-06-19 (×14): 10 mg via INTRAVENOUS
  Filled 2016-06-15 (×14): qty 2

## 2016-06-15 MED ORDER — DEXMEDETOMIDINE HCL IN NACL 200 MCG/50ML IV SOLN
INTRAVENOUS | Status: AC
  Filled 2016-06-15: qty 50

## 2016-06-15 MED ORDER — CALCIUM CHLORIDE 10 % IV SOLN
INTRAVENOUS | Status: DC | PRN
  Administered 2016-06-15: 200 mg via INTRAVENOUS

## 2016-06-15 MED ORDER — TRANEXAMIC ACID 1000 MG/10ML IV SOLN
1.5000 mg/kg/h | INTRAVENOUS | Status: DC
  Filled 2016-06-15: qty 25

## 2016-06-15 MED ORDER — METOPROLOL TARTRATE 5 MG/5ML IV SOLN: 2.5000 mg | INTRAVENOUS | Status: DC | PRN

## 2016-06-15 MED ORDER — MIDAZOLAM HCL 5 MG/5ML IJ SOLN
INTRAMUSCULAR | Status: DC | PRN
  Administered 2016-06-15: 3 mg via INTRAVENOUS
  Administered 2016-06-15: 2 mg via INTRAVENOUS
  Administered 2016-06-15: 5 mg via INTRAVENOUS

## 2016-06-15 MED ORDER — HEPARIN SODIUM (PORCINE) 1000 UNIT/ML IJ SOLN
INTRAMUSCULAR | Status: DC | PRN
  Administered 2016-06-15: 39000 [IU] via INTRAVENOUS

## 2016-06-15 MED ORDER — SODIUM CHLORIDE 0.9 % IV SOLN: 30.0000 meq | Freq: Once | INTRAVENOUS | Status: DC

## 2016-06-15 MED ORDER — NITROGLYCERIN IN D5W 200-5 MCG/ML-% IV SOLN
0.0000 ug/min | INTRAVENOUS | Status: DC
  Administered 2016-06-15: 40 ug/min via INTRAVENOUS

## 2016-06-15 MED ORDER — SODIUM CHLORIDE 0.9 % IV SOLN
INTRAVENOUS | Status: DC
  Administered 2016-06-16: 13:00:00 via INTRAVENOUS

## 2016-06-15 MED ORDER — ONDANSETRON HCL 4 MG/2ML IJ SOLN: 4.0000 mg | Freq: Four times a day (QID) | INTRAMUSCULAR | Status: DC | PRN

## 2016-06-15 MED ORDER — ASPIRIN 81 MG PO CHEW: 324.0000 mg | CHEWABLE_TABLET | Freq: Every day | ORAL | Status: DC

## 2016-06-15 MED ORDER — ACETAMINOPHEN 160 MG/5ML PO SOLN: 650.0000 mg | Freq: Once | ORAL | Status: AC

## 2016-06-15 MED ORDER — FAMOTIDINE IN NACL 20-0.9 MG/50ML-% IV SOLN
20.0000 mg | Freq: Two times a day (BID) | INTRAVENOUS | Status: AC
  Administered 2016-06-15: 20 mg via INTRAVENOUS

## 2016-06-15 MED ORDER — SODIUM CHLORIDE 0.9% FLUSH
10.0000 mL | INTRAVENOUS | Status: DC | PRN
  Administered 2016-06-15 – 2016-06-17 (×3): 10 mL via INTRAVENOUS
  Filled 2016-06-15 (×2): qty 10

## 2016-06-15 MED ORDER — BISACODYL 5 MG PO TBEC
10.0000 mg | DELAYED_RELEASE_TABLET | Freq: Every day | ORAL | Status: DC
  Administered 2016-06-16 – 2016-06-20 (×5): 10 mg via ORAL
  Filled 2016-06-15 (×5): qty 2

## 2016-06-15 MED ORDER — VANCOMYCIN HCL IN DEXTROSE 1-5 GM/200ML-% IV SOLN
1000.0000 mg | Freq: Once | INTRAVENOUS | Status: AC
  Administered 2016-06-15: 1000 mg via INTRAVENOUS
  Filled 2016-06-15 (×2): qty 200

## 2016-06-15 MED ORDER — CHLORHEXIDINE GLUCONATE 0.12% ORAL RINSE (MEDLINE KIT)
15.0000 mL | Freq: Two times a day (BID) | OROMUCOSAL | Status: DC
  Administered 2016-06-15 – 2016-06-16 (×2): 15 mL via OROMUCOSAL

## 2016-06-15 MED ORDER — SODIUM CHLORIDE 0.9% FLUSH
10.0000 mL | Freq: Two times a day (BID) | INTRAVENOUS | Status: DC
  Administered 2016-06-16 – 2016-06-18 (×4): 10 mL via INTRAVENOUS

## 2016-06-15 MED ORDER — MAGNESIUM SULFATE 4 GM/100ML IV SOLN
4.0000 g | Freq: Once | INTRAVENOUS | Status: AC
  Administered 2016-06-15: 4 g via INTRAVENOUS
  Filled 2016-06-15: qty 100

## 2016-06-15 MED ORDER — HEMOSTATIC AGENTS (NO CHARGE) OPTIME
TOPICAL | Status: DC | PRN
  Administered 2016-06-15 (×3): 1 via TOPICAL

## 2016-06-15 MED ORDER — SODIUM CHLORIDE 0.9 % IJ SOLN
OROMUCOSAL | Status: DC | PRN
  Administered 2016-06-15 (×3): 4 mL via TOPICAL

## 2016-06-15 MED ORDER — PROTAMINE SULFATE 10 MG/ML IV SOLN
INTRAVENOUS | Status: AC
  Filled 2016-06-15: qty 10

## 2016-06-15 MED ORDER — FENTANYL CITRATE (PF) 250 MCG/5ML IJ SOLN
INTRAMUSCULAR | Status: DC | PRN
  Administered 2016-06-15: 100 ug via INTRAVENOUS
  Administered 2016-06-15 (×3): 150 ug via INTRAVENOUS
  Administered 2016-06-15: 100 ug via INTRAVENOUS
  Administered 2016-06-15: 150 ug via INTRAVENOUS
  Administered 2016-06-15 (×2): 50 ug via INTRAVENOUS
  Administered 2016-06-15 (×3): 100 ug via INTRAVENOUS
  Administered 2016-06-15: 50 ug via INTRAVENOUS

## 2016-06-15 MED ORDER — METOPROLOL TARTRATE 25 MG/10 ML ORAL SUSPENSION: 12.5000 mg | Freq: Two times a day (BID) | ORAL | Status: DC

## 2016-06-15 MED ORDER — PHENYLEPHRINE HCL 10 MG/ML IJ SOLN
0.0000 ug/min | INTRAMUSCULAR | Status: DC
  Administered 2016-06-15: 5 ug/min via INTRAVENOUS
  Filled 2016-06-15: qty 2

## 2016-06-15 MED ORDER — SODIUM CHLORIDE 0.9 % IV SOLN
30.0000 meq | Freq: Once | INTRAVENOUS | Status: AC
  Administered 2016-06-15: 30 meq via INTRAVENOUS
  Filled 2016-06-15: qty 15

## 2016-06-15 MED ORDER — VECURONIUM BROMIDE 10 MG IV SOLR
INTRAVENOUS | Status: AC
  Filled 2016-06-15: qty 10

## 2016-06-15 MED ORDER — SODIUM CHLORIDE 0.9 % IV SOLN
INTRAVENOUS | Status: DC
  Filled 2016-06-15: qty 2.5

## 2016-06-15 MED ORDER — EPHEDRINE 5 MG/ML INJ
INTRAVENOUS | Status: AC
  Filled 2016-06-15: qty 10

## 2016-06-15 MED ORDER — ACETAMINOPHEN 500 MG PO TABS
1000.0000 mg | ORAL_TABLET | Freq: Four times a day (QID) | ORAL | Status: DC
  Administered 2016-06-15 – 2016-06-19 (×14): 1000 mg via ORAL
  Filled 2016-06-15 (×14): qty 2

## 2016-06-15 MED ORDER — PROTAMINE SULFATE 10 MG/ML IV SOLN
INTRAVENOUS | Status: AC
  Filled 2016-06-15: qty 5

## 2016-06-15 MED ORDER — FENTANYL CITRATE (PF) 250 MCG/5ML IJ SOLN
INTRAMUSCULAR | Status: AC
  Filled 2016-06-15: qty 25

## 2016-06-15 MED ORDER — SODIUM CHLORIDE 0.9% FLUSH
3.0000 mL | Freq: Two times a day (BID) | INTRAVENOUS | Status: DC
  Administered 2016-06-16 – 2016-06-18 (×5): 3 mL via INTRAVENOUS

## 2016-06-15 MED ORDER — TRAMADOL HCL 50 MG PO TABS
50.0000 mg | ORAL_TABLET | ORAL | Status: DC | PRN
  Administered 2016-06-16 – 2016-06-17 (×2): 100 mg via ORAL
  Filled 2016-06-15 (×2): qty 2

## 2016-06-15 MED ORDER — SODIUM CHLORIDE 0.9 % IV SOLN
INTRAVENOUS | Status: DC | PRN
  Administered 2016-06-15: 13:00:00 via INTRAVENOUS

## 2016-06-15 MED FILL — Magnesium Sulfate Inj 50%: INTRAMUSCULAR | Qty: 10 | Status: AC

## 2016-06-15 MED FILL — Heparin Sodium (Porcine) Inj 1000 Unit/ML: INTRAMUSCULAR | Qty: 30 | Status: AC

## 2016-06-15 MED FILL — Potassium Chloride Inj 2 mEq/ML: INTRAVENOUS | Qty: 40 | Status: AC

## 2016-06-15 SURGICAL SUPPLY — 75 items
BAG DECANTER FOR FLEXI CONT (MISCELLANEOUS) ×3 IMPLANT
BANDAGE ACE 4X5 VEL STRL LF (GAUZE/BANDAGES/DRESSINGS) IMPLANT
BANDAGE ACE 6X5 VEL STRL LF (GAUZE/BANDAGES/DRESSINGS) IMPLANT
BANDAGE ELASTIC 4 VELCRO ST LF (GAUZE/BANDAGES/DRESSINGS) ×6 IMPLANT
BANDAGE ELASTIC 6 VELCRO ST LF (GAUZE/BANDAGES/DRESSINGS) ×6 IMPLANT
BENZOIN TINCTURE PRP APPL 2/3 (GAUZE/BANDAGES/DRESSINGS) ×3 IMPLANT
BLADE STERNUM SYSTEM 6 (BLADE) ×3 IMPLANT
BLADE SURG 11 STRL SS (BLADE) ×3 IMPLANT
BNDG GAUZE ELAST 4 BULKY (GAUZE/BANDAGES/DRESSINGS) ×6 IMPLANT
CANISTER SUCT 3000ML PPV (MISCELLANEOUS) ×3 IMPLANT
CANNULA ARTERIAL NVNT 3/8 22FR (MISCELLANEOUS) ×3 IMPLANT
CATH CPB KIT GERHARDT (MISCELLANEOUS) ×3 IMPLANT
CATH THORACIC 28FR (CATHETERS) ×3 IMPLANT
CRADLE DONUT ADULT HEAD (MISCELLANEOUS) ×3 IMPLANT
DERMABOND ADVANCED (GAUZE/BANDAGES/DRESSINGS) ×2
DERMABOND ADVANCED .7 DNX12 (GAUZE/BANDAGES/DRESSINGS) ×4 IMPLANT
DRAIN CHANNEL 28F RND 3/8 FF (WOUND CARE) ×3 IMPLANT
DRAPE CARDIOVASCULAR INCISE (DRAPES) ×1
DRAPE SLUSH/WARMER DISC (DRAPES) ×3 IMPLANT
DRAPE SRG 135X102X78XABS (DRAPES) ×2 IMPLANT
DRSG AQUACEL AG ADV 3.5X14 (GAUZE/BANDAGES/DRESSINGS) ×3 IMPLANT
ELECT BLADE 4.0 EZ CLEAN MEGAD (MISCELLANEOUS) ×3
ELECT REM PT RETURN 9FT ADLT (ELECTROSURGICAL) ×6
ELECTRODE BLDE 4.0 EZ CLN MEGD (MISCELLANEOUS) ×2 IMPLANT
ELECTRODE REM PT RTRN 9FT ADLT (ELECTROSURGICAL) ×4 IMPLANT
FELT TEFLON 1X6 (MISCELLANEOUS) ×3 IMPLANT
GAUZE SPONGE 4X4 12PLY STRL (GAUZE/BANDAGES/DRESSINGS) IMPLANT
GAUZE SPONGE 4X4 12PLY STRL LF (GAUZE/BANDAGES/DRESSINGS) ×9 IMPLANT
GLOVE BIO SURGEON STRL SZ 6.5 (GLOVE) ×30 IMPLANT
GOWN STRL REUS W/ TWL LRG LVL3 (GOWN DISPOSABLE) ×18 IMPLANT
GOWN STRL REUS W/TWL LRG LVL3 (GOWN DISPOSABLE) ×9
HEMOSTAT POWDER SURGIFOAM 1G (HEMOSTASIS) ×9 IMPLANT
HEMOSTAT SURGICEL 2X14 (HEMOSTASIS) ×6 IMPLANT
KIT BASIN OR (CUSTOM PROCEDURE TRAY) ×3 IMPLANT
KIT CATH SUCT 8FR (CATHETERS) ×3 IMPLANT
KIT ROOM TURNOVER OR (KITS) ×3 IMPLANT
KIT SUCTION CATH 14FR (SUCTIONS) ×6 IMPLANT
KIT VASOVIEW HEMOPRO VH 3000 (KITS) ×3 IMPLANT
LEAD PACING MYOCARDI (MISCELLANEOUS) ×3 IMPLANT
MARKER GRAFT CORONARY BYPASS (MISCELLANEOUS) ×12 IMPLANT
NS IRRIG 1000ML POUR BTL (IV SOLUTION) ×15 IMPLANT
PACK OPEN HEART (CUSTOM PROCEDURE TRAY) ×3 IMPLANT
PAD ARMBOARD 7.5X6 YLW CONV (MISCELLANEOUS) ×6 IMPLANT
PAD ELECT DEFIB RADIOL ZOLL (MISCELLANEOUS) ×3 IMPLANT
PENCIL BUTTON HOLSTER BLD 10FT (ELECTRODE) ×3 IMPLANT
PUNCH AORTIC ROTATE  4.5MM 8IN (MISCELLANEOUS) ×3 IMPLANT
SET CARDIOPLEGIA MPS 5001102 (MISCELLANEOUS) ×3 IMPLANT
SOLUTION ANTI FOG 6CC (MISCELLANEOUS) ×3 IMPLANT
SPONGE LAP 18X18 X RAY DECT (DISPOSABLE) ×12 IMPLANT
SURGIFLO W/THROMBIN 8M KIT (HEMOSTASIS) ×3 IMPLANT
SUT BONE WAX W31G (SUTURE) ×3 IMPLANT
SUT ETHILON 3 0 FSL (SUTURE) ×3 IMPLANT
SUT MNCRL AB 4-0 PS2 18 (SUTURE) ×3 IMPLANT
SUT PROLENE 3 0 SH DA (SUTURE) ×3 IMPLANT
SUT PROLENE 3 0 SH1 36 (SUTURE) ×3 IMPLANT
SUT PROLENE 4 0 TF (SUTURE) ×6 IMPLANT
SUT PROLENE 6 0 CC (SUTURE) ×6 IMPLANT
SUT PROLENE 7 0 BV1 MDA (SUTURE) ×18 IMPLANT
SUT PROLENE 8 0 BV175 6 (SUTURE) ×3 IMPLANT
SUT STEEL 6MS V (SUTURE) ×3 IMPLANT
SUT STEEL SZ 6 DBL 3X14 BALL (SUTURE) ×6 IMPLANT
SUT VIC AB 1 CTX 18 (SUTURE) ×6 IMPLANT
SUT VIC AB 2-0 CT1 27 (SUTURE) ×2
SUT VIC AB 2-0 CT1 TAPERPNT 27 (SUTURE) ×4 IMPLANT
SUTURE E-PAK OPEN HEART (SUTURE) ×3 IMPLANT
SYSTEM SAHARA CHEST DRAIN ATS (WOUND CARE) ×3 IMPLANT
TAPE CLOTH SURG 4X10 WHT LF (GAUZE/BANDAGES/DRESSINGS) ×3 IMPLANT
TOWEL GREEN STERILE (TOWEL DISPOSABLE) ×3 IMPLANT
TOWEL GREEN STERILE FF (TOWEL DISPOSABLE) ×6 IMPLANT
TOWEL OR 17X24 6PK STRL BLUE (TOWEL DISPOSABLE) ×3 IMPLANT
TOWEL OR 17X26 10 PK STRL BLUE (TOWEL DISPOSABLE) ×3 IMPLANT
TRAY FOLEY IC TEMP SENS 16FR (CATHETERS) ×3 IMPLANT
TUBING INSUFFLATION (TUBING) ×3 IMPLANT
UNDERPAD 30X30 (UNDERPADS AND DIAPERS) ×3 IMPLANT
WATER STERILE IRR 1000ML POUR (IV SOLUTION) ×6 IMPLANT

## 2016-06-15 NOTE — Anesthesia Procedure Notes (Signed)
Procedure Name: Intubation Date/Time: 06/15/2016 7:43 AM Performed by: Kyung Rudd Pre-anesthesia Checklist: Patient identified, Emergency Drugs available, Suction available and Patient being monitored Patient Re-evaluated:Patient Re-evaluated prior to inductionOxygen Delivery Method: Circle system utilized Preoxygenation: Pre-oxygenation with 100% oxygen Intubation Type: IV induction Ventilation: Mask ventilation without difficulty, Two handed mask ventilation required and Oral airway inserted - appropriate to patient size Laryngoscope Size: Mac and 4 Grade View: Grade I Tube type: Oral Tube size: 8.0 mm Number of attempts: 1 Airway Equipment and Method: Stylet Placement Confirmation: ETT inserted through vocal cords under direct vision,  positive ETCO2 and breath sounds checked- equal and bilateral Secured at: 22 cm Tube secured with: Tape Dental Injury: Teeth and Oropharynx as per pre-operative assessment

## 2016-06-15 NOTE — OR Nursing (Signed)
1610 Right leg incision made per T. Asa Lente, [place] for endoscopic harvesting of right saphenous vein.

## 2016-06-15 NOTE — Anesthesia Preprocedure Evaluation (Addendum)
Anesthesia Evaluation  Patient identified by MRN, date of birth, ID band Patient awake    Reviewed: Allergy & Precautions, NPO status , Patient's Chart, lab work & pertinent test results  Airway Mallampati: III  TM Distance: >3 FB Neck ROM: Full    Dental  (+) Teeth Intact, Dental Advisory Given   Pulmonary neg pulmonary ROS,    breath sounds clear to auscultation       Cardiovascular + CAD and + Past MI   Rhythm:Regular Rate:Normal     Neuro/Psych    GI/Hepatic negative GI ROS, Neg liver ROS,   Endo/Other  negative endocrine ROS  Renal/GU negative Renal ROS     Musculoskeletal negative musculoskeletal ROS (+)   Abdominal (+) + obese,   Peds  Hematology negative hematology ROS (+)   Anesthesia Other Findings   Reproductive/Obstetrics                            Anesthesia Physical Anesthesia Plan  ASA: III  Anesthesia Plan: General   Post-op Pain Management:    Induction: Intravenous  Airway Management Planned: Oral ETT  Additional Equipment: Arterial line, PA Cath, Ultrasound Guidance Line Placement and TEE  Intra-op Plan:   Post-operative Plan: Extubation in OR  Informed Consent: I have reviewed the patients History and Physical, chart, labs and discussed the procedure including the risks, benefits and alternatives for the proposed anesthesia with the patient or authorized representative who has indicated his/her understanding and acceptance.   Dental advisory given  Plan Discussed with: CRNA and Anesthesiologist  Anesthesia Plan Comments:        Anesthesia Quick Evaluation

## 2016-06-15 NOTE — OR Nursing (Signed)
7829 Chest incision made per Dr. Tyrone Sage.

## 2016-06-15 NOTE — Progress Notes (Signed)
  Echocardiogram Echocardiogram Transesophageal has been performed.  Arvil Chaco 06/15/2016, 9:45 AM

## 2016-06-15 NOTE — Brief Op Note (Addendum)
06/15/2016  3:14 PM  PATIENT:  [name] Dec  61 y.o. male  PRE-OPERATIVE DIAGNOSIS:  1. S/p STEMI 2.CAD  POST-OPERATIVE DIAGNOSIS:    1. S/p STEMI 2.CAD  PROCEDURE:  TRANSESOPHAGEAL ECHOCARDIOGRAM (TEE), MEDIAN STERNOTOMY for CORONARY ARTERY BYPASS GRAFTING (CABG)x4 (LIMA to LAD, SVG to DIAGONAL, SVG to OM, and SVG to PDA) WITH ENDOSCOPIC HARVESTING OF LEFT AND RIGHT SAPHENOUS VEINS   SURGEON:  Surgeon(s) and Role:    * [name], MD - Primary  PHYSICIAN ASSISTANT: 1. Helayne Seminole PA-C 2. Donielle Zimmerman PA-C  ANESTHESIA:   general  EBL:  Total I/O In: 4391 [I.V.:2750; Blood:541; IV Piggyback:1100] Out: 3640 [Urine:1425; Blood:2215]  DRAINS: Chest tubes placed in the mediastinal and pleural spaces   COUNTS CORRECT:  YES  DICTATION: .Dragon Dictation  PLAN OF CARE: Admit to inpatient   PATIENT DISPOSITION:  ICU - intubated and hemodynamically stable.   Delay start of Pharmacological VTE agent (>24hrs) due to surgical blood loss or risk of bleeding: yes  BASELINE WEIGHT: 128 kg

## 2016-06-15 NOTE — Anesthesia Procedure Notes (Signed)
Central Venous Catheter Insertion Performed by: Kipp Brood, anesthesiologist Start/End4/08/2016 6:45 AM, 06/15/2016 6:50 AM Patient location: Pre-op. Preanesthetic checklist: patient identified, IV checked, site marked, risks and benefits discussed, surgical consent, monitors and equipment checked, pre-op evaluation and timeout performed Position: supine Lidocaine 1% used for infiltration Hand hygiene performed , maximum sterile barriers used  and Seldinger technique used Catheter size: 8.5 Fr PA cath was placed.Procedure performed using ultrasound guided technique. Ultrasound Notes:anatomy identified, needle tip was noted to be adjacent to the nerve/plexus identified and no ultrasound evidence of intravascular and/or intraneural injection Attempts: 1 Following insertion, line sutured and dressing applied. Post procedure assessment: blood return through all ports, free fluid flow and no air  Patient tolerated the procedure well with no immediate complications.

## 2016-06-15 NOTE — Progress Notes (Signed)
Patient ID: [name], male   DOB: September 05, 1955, 61 y.o.   MRN: [id number] EVENING ROUNDS NOTE :     [address]       [name] [address]             [phone]                 Day of Surgery Procedure(s) (LRB): CORONARY ARTERY BYPASS GRAFTING (CABG)x4(LIMA to LAD, SVG to DIAGONAL, SVG to OM, and SVG to PDA)   WITH ENDOSCOPIC HARVESTING OF LEFT AND RIGHT SAPHENOUS VEINS. (N/A) TRANSESOPHAGEAL ECHOCARDIOGRAM (TEE) (N/A)  Total Length of Stay:  LOS: 6 days  BP (!) 77/55 (BP Location: Right Arm)   Pulse 90   Temp 99.1 F (37.3 C)   Resp 15   Ht  (1.905 m)   Wt 280 lb (127 kg)   SpO2 99%   BMI 35.00 kg/m   .Intake/Output      04/06 0701 - 04/07 0700   P.O.    I.V. (mL/kg) 3148 (24.8)   Blood 541   NG/GT 20   IV Piggyback 1528   Total Intake(mL/kg) 5237 (41.2)   Urine (mL/kg/hr) 2245 (1.3)   Emesis/NG output 200 (0.1)   Blood 2215 (1.3)   Chest Tube 80 (0)   Total Output 4740   Net +497         . sodium chloride 20 mL/hr (06/15/16 2000)  . sodium chloride 20 mL/hr at 06/15/16 2000  . sodium chloride    . dexmedetomidine (PRECEDEX) IV infusion Stopped (06/15/16 1851)  . DOPamine    . insulin (NOVOLIN-R) infusion 3.2 Units/hr (06/15/16 2000)  . lactated ringers 20 mL/hr at 06/15/16 2000  . nitroGLYCERIN 10 mcg/min (06/15/16 1817)  . phenylephrine (NEO-SYNEPHRINE) Adult infusion 5 mcg/min (06/15/16 2000)     Lab Results  Component Value Date   WBC 10.7 (H) 06/15/2016   HGB 12.1 (L) 06/15/2016   HCT 37.1 (L) 06/15/2016   PLT 122 (L) 06/15/2016   GLUCOSE 108 (H) 06/15/2016   CHOL 222 (H) 06/09/2016   TRIG 273 (H) 06/09/2016   HDL 41 06/09/2016   LDLCALC 126 (H) 06/09/2016   ALT 34 06/14/2016   AST 26 06/14/2016   NA 141 06/15/2016   K 3.8 06/15/2016   CL 102 06/15/2016   CREATININE 0.90 06/15/2016   BUN 15 06/15/2016   CO2 26 06/15/2016   TSH 1.764 06/11/2016   INR 1.19 06/15/2016   HGBA1C 6.2 (H) 06/09/2016   Extubated ,  awake and alert , not bleeding , on low dose neo  Stable postop  [name] MD  Beeper [phone] Office [phone] 06/15/2016 8:36 PM

## 2016-06-15 NOTE — Transfer of Care (Signed)
Immediate Anesthesia Transfer of Care Note  Patient: [name]  Procedure(s) Performed: Procedure(s): CORONARY ARTERY BYPASS GRAFTING (CABG)x4(LIMA to LAD, SVG to DIAGONAL, SVG to OM, and SVG to PDA)   WITH ENDOSCOPIC HARVESTING OF LEFT AND RIGHT SAPHENOUS VEINS. (N/A) TRANSESOPHAGEAL ECHOCARDIOGRAM (TEE) (N/A)  Patient Location: SICU  Anesthesia Type:General  Level of Consciousness: sedated, unresponsive and Patient remains intubated per anesthesia plan  Airway & Oxygen Therapy: Patient remains intubated per anesthesia plan and Patient placed on Ventilator (see vital sign flow sheet for setting)  Post-op Assessment: Report given to RN and Post -op Vital signs reviewed and stable  Post vital signs: Reviewed and stable  Last Vitals:  Vitals:   06/15/16 0500 06/15/16 1447  BP: 129/76 129/71  Pulse: 81   Resp:  16  Temp: 36.5 C     Last Pain:  Vitals:   06/15/16 0500  TempSrc: Oral  PainSc:       Patients Stated Pain Goal: 2 (06/13/16 1345)  Complications: No apparent anesthesia complications

## 2016-06-15 NOTE — Procedures (Signed)
Extubation Procedure Note  Patient Details:   Name: [name] DOB: 08/01/1955 MRN: [id number]   Airway Documentation:     Evaluation  O2 sats: stable throughout Complications: No apparent complications Patient did tolerate procedure well. Bilateral Breath Sounds: Clear   Yes  Pt extubated per protocol. NIF-30 VC 800. IS done x10 500-674ml.  Roanna Raider 06/15/2016, 7:42 PM

## 2016-06-15 NOTE — OR Nursing (Signed)
1357 Cath Lab called and made aware pt will be coming through in app. 15 to 20 mins.

## 2016-06-15 NOTE — Anesthesia Procedure Notes (Signed)
Central Venous Catheter Insertion Performed by: Kipp Brood, anesthesiologist Start/End4/08/2016 6:50 AM, 06/15/2016 6:55 AM Patient location: Pre-op. Preanesthetic checklist: patient identified, IV checked, site marked, risks and benefits discussed, surgical consent, monitors and equipment checked, pre-op evaluation and timeout performed Position: supine Hand hygiene performed , maximum sterile barriers used  and Seldinger technique used Catheter size: 8 Fr Total catheter length 17. Central line was placed.Double lumen Ultrasound Notes:anatomy identified Attempts: 1 Following insertion, line sutured and dressing applied. Post procedure assessment: blood return through all ports, free fluid flow and no air  Patient tolerated the procedure well with no immediate complications.

## 2016-06-15 NOTE — Brief Op Note (Deleted)
06/09/2016 - 06/15/2016  11:58 AM  PATIENT:  [name]  61 y.o. male  PRE-OPERATIVE DIAGNOSIS:  CAD  POST-OPERATIVE DIAGNOSIS:  CAD  PROCEDURE:  Procedure(s):  CORONARY ARTERY BYPASS GRAFTING x4  -LIMA to LAD -SVG to DIAGONAL -SVG to OM -SVG to PDA  ENDOSCOPIC HARVEST GREATER SAPHENOUS VEIN -Right LEg  TRANSESOPHAGEAL ECHOCARDIOGRAM (TEE) (N/A)  SURGEON:  Surgeon(s) and Role:    * [name], MD - Primary  PHYSICIAN ASSISTANT: Erin Barrett PA-C  ANESTHESIA:   general  EBL:  Total I/O In: 1000 [I.V.:1000] Out: 675 [Urine:675]  BLOOD ADMINISTERED: CELLSAVER  DRAINS: Left Pleural Chest Tube, Mediastinal Chest Drains   LOCAL MEDICATIONS USED:  NONE  SPECIMEN:  No Specimen  DISPOSITION OF SPECIMEN:  N/A  COUNTS:  YES  TOURNIQUET:  * No tourniquets in log *  DICTATION: .Dragon Dictation  PLAN OF CARE: Admit to inpatient   PATIENT DISPOSITION:  ICU - intubated and hemodynamically stable.   Delay start of Pharmacological VTE agent (>24hrs) due to surgical blood loss or risk of bleeding: yes

## 2016-06-15 NOTE — Progress Notes (Signed)
      [address]       [name] [address]             [phone]     Pre Procedure note for inpatients:   [name] has been scheduled for Procedure(s): CORONARY ARTERY BYPASS GRAFTING (CABG) (N/A) TRANSESOPHAGEAL ECHOCARDIOGRAM (TEE) (N/A) today. The various methods of treatment have been discussed with the patient. After consideration of the risks, benefits and treatment options the patient has consented to the planned procedure.   The patient has been seen and labs reviewed. There are no changes in the patient's condition to prevent proceeding with the planned procedure today.  Recent labs:  Lab Results  Component Value Date   WBC 9.3 06/15/2016   HGB 13.3 06/15/2016   HCT 41.1 06/15/2016   PLT 218 06/15/2016   GLUCOSE 92 06/15/2016   CHOL 222 (H) 06/09/2016   TRIG 273 (H) 06/09/2016   HDL 41 06/09/2016   LDLCALC 126 (H) 06/09/2016   ALT 34 06/14/2016   AST 26 06/14/2016   NA 137 06/15/2016   K 4.2 06/15/2016   CL 101 06/15/2016   CREATININE 1.16 06/15/2016   BUN 14 06/15/2016   CO2 26 06/15/2016   TSH 1.764 06/11/2016   INR 1.04 06/09/2016   HGBA1C 6.2 (H) 06/09/2016    [name], MD 06/15/2016 7:10 AM

## 2016-06-15 NOTE — OR Nursing (Signed)
0827 Left leg incision made per T. Asa Lente, [place] for endoscopic harvesting of left saphenous vein.

## 2016-06-16 ENCOUNTER — Inpatient Hospital Stay (HOSPITAL_COMMUNITY): Payer: Non-veteran care

## 2016-06-16 LAB — GLUCOSE, CAPILLARY
Comment 1: 9
Glucose-Capillary: 100 mg/dL — ABNORMAL HIGH (ref 65–99)
Glucose-Capillary: 103 mg/dL — ABNORMAL HIGH (ref 65–99)
Glucose-Capillary: 108 mg/dL — ABNORMAL HIGH (ref 65–99)
Glucose-Capillary: 108 mg/dL — ABNORMAL HIGH (ref 65–99)
Glucose-Capillary: 108 mg/dL — ABNORMAL HIGH (ref 65–99)
Glucose-Capillary: 111 mg/dL — ABNORMAL HIGH (ref 65–99)
Glucose-Capillary: 114 mg/dL — ABNORMAL HIGH (ref 65–99)
Glucose-Capillary: 114 mg/dL — ABNORMAL HIGH (ref 65–99)
Glucose-Capillary: 114 mg/dL — ABNORMAL HIGH (ref 65–99)
Glucose-Capillary: 115 mg/dL — ABNORMAL HIGH (ref 65–99)
Glucose-Capillary: 115 mg/dL — ABNORMAL HIGH (ref 65–99)
Glucose-Capillary: 117 mg/dL — ABNORMAL HIGH (ref 65–99)
Glucose-Capillary: 122 mg/dL — ABNORMAL HIGH (ref 65–99)
Glucose-Capillary: 134 mg/dL — ABNORMAL HIGH (ref 65–99)
Glucose-Capillary: 142 mg/dL — ABNORMAL HIGH (ref 65–99)

## 2016-06-16 LAB — POCT I-STAT 3, ART BLOOD GAS (G3+)
Acid-base deficit: 2 mmol/L (ref 0.0–2.0)
Bicarbonate: 23.3 mmol/L (ref 20.0–28.0)
O2 Saturation: 98 %
Patient temperature: 37.1
TCO2: 24 mmol/L (ref 0–100)
pCO2 arterial: 38.7 mmHg (ref 32.0–48.0)
pH, Arterial: 7.388 (ref 7.350–7.450)
pO2, Arterial: 104 mmHg (ref 83.0–108.0)

## 2016-06-16 LAB — BASIC METABOLIC PANEL
Anion gap: 10 (ref 5–15)
BUN: 14 mg/dL (ref 6–20)
CALCIUM: 8.5 mg/dL — AB (ref 8.9–10.3)
CO2: 24 mmol/L (ref 22–32)
CREATININE: 0.95 mg/dL (ref 0.61–1.24)
Chloride: 106 mmol/L (ref 101–111)
GFR calc non Af Amer: >60 mL/min (ref >60)
GLUCOSE: 111 mg/dL — AB (ref 65–99)
Potassium: 4.1 mmol/L (ref 3.5–5.1)
SODIUM: 140 mmol/L (ref 135–145)

## 2016-06-16 LAB — POCT I-STAT, CHEM 8
BUN: 19 mg/dL (ref 6–20)
Calcium, Ion: 1.21 mmol/L (ref 1.15–1.40)
Chloride: 102 mmol/L (ref 101–111)
Creatinine, Ser: 1.3 mg/dL — ABNORMAL HIGH (ref 0.61–1.24)
Glucose, Bld: 146 mg/dL — ABNORMAL HIGH (ref 65–99)
HCT: 36 % — ABNORMAL LOW (ref 39.0–52.0)
Hemoglobin: 12.2 g/dL — ABNORMAL LOW (ref 13.0–17.0)
Potassium: 4.6 mmol/L (ref 3.5–5.1)
Sodium: 139 mmol/L (ref 135–145)
TCO2: 27 mmol/L (ref 0–100)

## 2016-06-16 LAB — CBC
HCT: 33.8 % — ABNORMAL LOW (ref 39.0–52.0)
Hemoglobin: 10.9 g/dL — ABNORMAL LOW (ref 13.0–17.0)
MCH: 29.6 pg (ref 26.0–34.0)
MCHC: 32.2 g/dL (ref 30.0–36.0)
MCV: 91.8 fL (ref 78.0–100.0)
Platelets: 126 10*3/uL — ABNORMAL LOW (ref 150–400)
RBC: 3.68 MIL/uL — ABNORMAL LOW (ref 4.22–5.81)
RDW: 13.8 % (ref 11.5–15.5)
WBC: 10.2 10*3/uL (ref 4.0–10.5)

## 2016-06-16 LAB — CREATININE, SERUM
CREATININE: 1.3 mg/dL — AB (ref 0.61–1.24)
GFR calc Af Amer: >60 mL/min (ref >60)
GFR, EST NON AFRICAN AMERICAN: 57 mL/min — AB (ref >60)

## 2016-06-16 LAB — MAGNESIUM
Magnesium: 2.2 mg/dL (ref 1.7–2.4)
Magnesium: 2.2 mg/dL (ref 1.7–2.4)

## 2016-06-16 MED ORDER — INSULIN DETEMIR 100 UNIT/ML ~~LOC~~ SOLN
15.0000 [IU] | Freq: Every day | SUBCUTANEOUS | Status: DC
  Administered 2016-06-17: 15 [IU] via SUBCUTANEOUS
  Filled 2016-06-16 (×2): qty 0.15

## 2016-06-16 MED ORDER — INSULIN ASPART 100 UNIT/ML ~~LOC~~ SOLN
0.0000 [IU] | SUBCUTANEOUS | Status: DC
  Administered 2016-06-16 – 2016-06-17 (×4): 2 [IU] via SUBCUTANEOUS
  Administered 2016-06-17: 4 [IU] via SUBCUTANEOUS
  Administered 2016-06-17 (×2): 2 [IU] via SUBCUTANEOUS

## 2016-06-16 MED ORDER — ENOXAPARIN SODIUM 40 MG/0.4ML ~~LOC~~ SOLN
40.0000 mg | Freq: Every day | SUBCUTANEOUS | Status: DC
  Administered 2016-06-16 – 2016-06-19 (×4): 40 mg via SUBCUTANEOUS
  Filled 2016-06-16 (×3): qty 0.4

## 2016-06-16 MED ORDER — INSULIN DETEMIR 100 UNIT/ML ~~LOC~~ SOLN
15.0000 [IU] | Freq: Every day | SUBCUTANEOUS | Status: DC
  Administered 2016-06-16 – 2016-06-18 (×2): 15 [IU] via SUBCUTANEOUS
  Filled 2016-06-16 (×5): qty 0.15

## 2016-06-16 MED ORDER — CHLORHEXIDINE GLUCONATE CLOTH 2 % EX PADS
6.0000 | MEDICATED_PAD | Freq: Every day | CUTANEOUS | Status: DC
  Administered 2016-06-16 – 2016-06-18 (×3): 6 via TOPICAL

## 2016-06-16 NOTE — Anesthesia Postprocedure Evaluation (Addendum)
Anesthesia Post Note  Patient: [name]  Procedure(s) Performed: Procedure(s) (LRB): CORONARY ARTERY BYPASS GRAFTING (CABG)x4(LIMA to LAD, SVG to DIAGONAL, SVG to OM, and SVG to PDA)   WITH ENDOSCOPIC HARVESTING OF LEFT AND RIGHT SAPHENOUS VEINS. (N/A) TRANSESOPHAGEAL ECHOCARDIOGRAM (TEE) (N/A)  Patient location during evaluation: SICU Anesthesia Type: General Level of consciousness: sedated Pain management: pain level controlled Vital Signs Assessment: post-procedure vital signs reviewed and stable Respiratory status: spontaneous breathing, nonlabored ventilation and respiratory function stable Cardiovascular status: unstable Anesthetic complications: no       Last Vitals:  Vitals:   06/16/16 0430 06/16/16 0500  BP:  97/60  Pulse: 90 90  Resp: 17 (!) 25  Temp: 37.2 C 37.4 C    Last Pain:  Vitals:   06/16/16 0500  TempSrc:   PainSc: 4                  Tylor Courtwright COKER

## 2016-06-16 NOTE — Addendum Note (Signed)
Addendum  created 06/16/16 0960 by Kipp Brood, MD   Sign clinical note

## 2016-06-16 NOTE — Progress Notes (Signed)
Patient ID: [name], male   DOB: 1955/09/10, 61 y.o.   MRN: [id number] EVENING ROUNDS NOTE :     [address]       [name] [address]             [phone]                 1 Day Post-Op Procedure(s) (LRB): CORONARY ARTERY BYPASS GRAFTING (CABG)x4(LIMA to LAD, SVG to DIAGONAL, SVG to OM, and SVG to PDA)   WITH ENDOSCOPIC HARVESTING OF LEFT AND RIGHT SAPHENOUS VEINS. (N/A) TRANSESOPHAGEAL ECHOCARDIOGRAM (TEE) (N/A)  Total Length of Stay:  LOS: 7 days  BP 116/76   Pulse 90   Temp 98.2 F (36.8 C) (Oral)   Resp 16   Ht  (1.905 m)   Wt 287 lb 7.7 oz (130.4 kg)   SpO2 92%   BMI 35.93 kg/m   .Intake/Output      04/07 0701 - 04/08 0700   P.O. 240   I.V. (mL/kg) 466.2 (3.6)   Blood    NG/GT    IV Piggyback 50   Total Intake(mL/kg) 756.2 (5.8)   Urine (mL/kg/hr) 740 (0.4)   Emesis/NG output    Blood    Chest Tube 110 (0.1)   Total Output 850   Net -93.9         . sodium chloride Stopped (06/16/16 1000)  . sodium chloride Stopped (06/16/16 1745)  . sodium chloride    . dexmedetomidine (PRECEDEX) IV infusion Stopped (06/15/16 1851)  . DOPamine    . insulin (NOVOLIN-R) infusion Stopped (06/16/16 1415)  . lactated ringers Stopped (06/16/16 1239)  . nitroGLYCERIN 10 mcg/min (06/15/16 1817)  . phenylephrine (NEO-SYNEPHRINE) Adult infusion Stopped (06/15/16 2200)     Lab Results  Component Value Date   WBC 10.2 06/16/2016   HGB 12.2 (L) 06/16/2016   HCT 36.0 (L) 06/16/2016   PLT 126 (L) 06/16/2016   GLUCOSE 146 (H) 06/16/2016   CHOL 222 (H) 06/09/2016   TRIG 273 (H) 06/09/2016   HDL 41 06/09/2016   LDLCALC 126 (H) 06/09/2016   ALT 34 06/14/2016   AST 26 06/14/2016   NA 139 06/16/2016   K 4.6 06/16/2016   CL 102 06/16/2016   CREATININE 1.30 (H) 06/16/2016   BUN 19 06/16/2016   CO2 24 06/16/2016   TSH 1.764 06/11/2016   INR 1.19 06/15/2016   HGBA1C 6.2 (H) 06/09/2016   Stable day  Sinus rhythm   [name]  MD  Beeper [phone] Office [phone] 06/16/2016 7:54 PM

## 2016-06-16 NOTE — Progress Notes (Signed)
Patient ID: [name], male   DOB: [birth year], 61 y.o.   MRN: [id number] TCTS DAILY ICU PROGRESS NOTE                   [address]            [place] [address]          [phone]   1 Day Post-Op Procedure(s) (LRB): CORONARY ARTERY BYPASS GRAFTING (CABG)x4(LIMA to LAD, SVG to DIAGONAL, SVG to OM, and SVG to PDA)   WITH ENDOSCOPIC HARVESTING OF LEFT AND RIGHT SAPHENOUS VEINS. (N/A) TRANSESOPHAGEAL ECHOCARDIOGRAM (TEE) (N/A)  Total Length of Stay:  LOS: 7 days   Subjective: Awake and alert , neuro intact, extubated last pm   Objective: Vital signs in last 24 hours: Temp:  [89.1 F (31.7 C)-99.3 F (37.4 C)] 98.8 F (37.1 C) (04/07 0900) Pulse Rate:  [89-93] 93 (04/07 0900) Cardiac Rhythm: Atrial paced (04/07 0730) Resp:  [12-25] 21 (04/07 0900) BP: (69-129)/(52-71) 110/61 (04/07 0900) SpO2:  [93 %-99 %] 96 % (04/07 0900) Arterial Line BP: (79-137)/(48-74) 130/63 (04/07 0900) FiO2 (%):  [50 %] 50 % (04/06 1850) Weight:  [287 lb 7.7 oz (130.4 kg)] 287 lb 7.7 oz (130.4 kg) (04/07 0600)  Filed Weights   06/14/16 0621 06/15/16 0500 06/16/16 0600  Weight: 281 lb 9.6 oz (127.7 kg) 280 lb (127 kg) 287 lb 7.7 oz (130.4 kg)    Weight change: 7 lb 7.7 oz (3.393 kg)   Hemodynamic parameters for last 24 hours: PAP: (18-43)/(8-23) 43/23 CO:  [5.6 L/min-7 L/min] 5.6 L/min CI:  [2.2 L/min/m2-2.7 L/min/m2] 2.2 L/min/m2  Intake/Output from previous day: 04/06 0701 - 04/07 0700 In: 6609.7 [P.O.:120; I.V.:3650.7; Blood:541; NG/GT:20; IV Piggyback:2278] Out: 2993 [Urine:2910; Emesis/NG output:200; Blood:2215; Chest Tube:390]  Intake/Output this shift: Total I/O In: 84.6 [I.V.:84.6] Out: 80 [Urine:80]  Current Meds: Scheduled Meds: . acetaminophen  1,000 mg Oral Q6H   Or  . acetaminophen (TYLENOL) oral liquid 160 mg/5 mL  1,000 mg Per Tube Q6H  . aspirin EC  325 mg Oral Daily   Or  . aspirin  324 mg Per Tube Daily  . atorvastatin  80 mg Oral q1800  .  bisacodyl  10 mg Oral Daily   Or  . bisacodyl  10 mg Rectal Daily  . cefUROXime (ZINACEF)  IV  1.5 g Intravenous Q12H  . chlorhexidine gluconate (MEDLINE KIT)  15 mL Mouth Rinse BID  . Chlorhexidine Gluconate Cloth  6 each Topical Daily  . docusate sodium  200 mg Oral Daily  . escitalopram  10 mg Oral Daily  . famotidine (PEPCID) IV  20 mg Intravenous Q12H  . insulin regular  0-10 Units Intravenous TID WC  . mouth rinse  15 mL Mouth Rinse QID  . metoCLOPramide (REGLAN) injection  10 mg Intravenous Q6H  . metoprolol tartrate  12.5 mg Oral BID   Or  . metoprolol tartrate  12.5 mg Per Tube BID  . [START ON 06/17/2016] pantoprazole  40 mg Oral Daily  . sodium chloride flush  10 mL Intravenous Q12H  . sodium chloride flush  3 mL Intravenous Q12H   Continuous Infusions: . sodium chloride 20 mL/hr at 06/16/16 0700  . sodium chloride 20 mL/hr at 06/16/16 0400  . sodium chloride    . dexmedetomidine (PRECEDEX) IV infusion Stopped (06/15/16 1851)  . DOPamine    . insulin (NOVOLIN-R) infusion 3.4 Units/hr (06/16/16 0858)  . lactated ringers 20 mL/hr at 06/16/16  0700  . nitroGLYCERIN 10 mcg/min (06/15/16 1817)  . phenylephrine (NEO-SYNEPHRINE) Adult infusion Stopped (06/15/16 2200)   PRN Meds:.sodium chloride, albumin human, cyclobenzaprine, lactated ringers, metoprolol, morphine injection, ondansetron (ZOFRAN) IV, oxyCODONE, sodium chloride flush, sodium chloride flush, traMADol  General appearance: alert and cooperative Neurologic: intact Heart: regular rate and rhythm, S1, S2 normal, no murmur, click, rub or gallop Lungs: clear to auscultation bilaterally Abdomen: soft, non-tender; bowel sounds normal; no masses,  no organomegaly Extremities: extremities normal, atraumatic, no cyanosis or edema and Homans sign is negative, no sign of DVT Wound: sternum stable  Lab Results: CBC: Recent Labs  06/15/16 1520 06/15/16 2057 06/16/16 0330  WBC 10.7*  --  10.2  HGB 12.1* 11.6* 10.9*    HCT 37.1* 34.0* 33.8*  PLT 122*  --  126*   BMET:  Recent Labs  06/15/16 0401  06/15/16 2057 06/16/16 0357  NA 137  < > 136 140  K 4.2  < > 4.2 4.1  CL 101  < > 108 106  CO2 26  --   --  24  GLUCOSE 92  < > 128* 111*  BUN 14  < > 14 14  CREATININE 1.16  < > 1.00 0.95  CALCIUM 9.2  --   --  8.5*  < > = values in this interval not displayed.  CMET: Lab Results  Component Value Date   WBC 10.2 06/16/2016   HGB 10.9 (L) 06/16/2016   HCT 33.8 (L) 06/16/2016   PLT 126 (L) 06/16/2016   GLUCOSE 111 (H) 06/16/2016   CHOL 222 (H) 06/09/2016   TRIG 273 (H) 06/09/2016   HDL 41 06/09/2016   LDLCALC 126 (H) 06/09/2016   ALT 34 06/14/2016   AST 26 06/14/2016   NA 140 06/16/2016   K 4.1 06/16/2016   CL 106 06/16/2016   CREATININE 0.95 06/16/2016   BUN 14 06/16/2016   CO2 24 06/16/2016   TSH 1.764 06/11/2016   INR 1.19 06/15/2016   HGBA1C 6.2 (H) 06/09/2016      PT/INR:  Recent Labs  06/15/16 1520  LABPROT 15.2  INR 1.19   Radiology: Dg Chest Port 1 View  Result Date: 06/16/2016 CLINICAL DATA:  Status post CABG EXAM: PORTABLE CHEST 1 VIEW COMPARISON:  Yesterday FINDINGS: Tracheal and esophageal extubation. Swan-Ganz catheter at the main pulmonary artery or outflow tract. Neighboring right IJ sheath with tip at the SVC. Unchanged cardiomegaly and prominent aortic tortuosity. Thoracic drains remain. Mildly improved lung volumes. Mild atelectasis. No Kerley lines or pneumothorax. IMPRESSION: Mildly improved lung volumes after extubation. No visible pneumothorax. Electronically Signed   By: Monte Fantasia M.D.   On: 06/16/2016 07:22   Dg Chest Port 1 View  Result Date: 06/15/2016 CLINICAL DATA:  Status post CABG. EXAM: PORTABLE CHEST 1 VIEW COMPARISON:  06/12/2016. FINDINGS: Interval post CABG changes. New endotracheal tube in satisfactory position. Right jugular Swan-Ganz catheter tip in the region of the proximal right main pulmonary artery. Additional right jugular catheter  tip in the inferior aspect of the superior vena cava. Mediastinal and left chest tubes without pneumothorax. Poor inspiration with grossly stable enlargement of the cardiac silhouette. Minimal left basilar atelectasis. Clear right lung. Thoracic spine degenerative changes. IMPRESSION: Minimal left basilar atelectasis. Electronically Signed   By: Claudie Revering M.D.   On: 06/15/2016 16:39     Assessment/Plan: S/P Procedure(s) (LRB): CORONARY ARTERY BYPASS GRAFTING (CABG)x4(LIMA to LAD, SVG to DIAGONAL, SVG to OM, and SVG to PDA)   WITH ENDOSCOPIC  HARVESTING OF LEFT AND RIGHT SAPHENOUS VEINS. (N/A) TRANSESOPHAGEAL ECHOCARDIOGRAM (TEE) (N/A) Mobilize Diuresis Diabetes control d/c tubes/lines Continue foley due to strict I&O, patient in ICU and urinary output monitoring See progression orders Expected Acute  Blood - loss Anemia    Grace Isaac 06/16/2016 9:48 AM

## 2016-06-16 NOTE — Plan of Care (Signed)
Problem: Cardiac: Goal: Ability to maintain an adequate cardiac output will improve Outcome: Progressing Cardiac output > 2.0 off all pressors and AAI paced @ 90 bpm. Goal: Will show no signs and symptoms of excessive bleeding Outcome: Progressing MCTD from 2 MTs and 1 PCT even after dangle at bedside.  Problem: Respiratory: Goal: Ability to tolerate decreased levels of ventilator support will improve Outcome: Completed/Met Date Met: 06/16/16 Pt awake and able to progress to Rapid Wean protocol after 4 hrs post-op. Pt met all parameters and extubated to 4 L [place], able to demonstrate good C / DB exercises and maintain O2 sats of 98-100 %.

## 2016-06-16 NOTE — Progress Notes (Signed)
Anesthesiology Follow-up:  Awake and alert, neuro intact, having incisional pain with deep breathing.  VS: T- 37.1 BP- 110/61 HR- 93 (SR) RR- 21 O2 sat 96% on 2 L PA 43/23 CO/CI 5.6/2.2  K-4.1 BUN/Cr 14/0.95 glucose- 111 H/H- 10.9/33.8 Platelets- 126,000  Extubated 4 1/2 hours post-op.  61 year old male Post-op day 1 following CABG X 4 following non-stemi with moderate dysfunction. Stable post-op course so far.

## 2016-06-17 ENCOUNTER — Inpatient Hospital Stay (HOSPITAL_COMMUNITY): Payer: Non-veteran care

## 2016-06-17 LAB — GLUCOSE, CAPILLARY
Glucose-Capillary: 108 mg/dL — ABNORMAL HIGH (ref 65–99)
Glucose-Capillary: 121 mg/dL — ABNORMAL HIGH (ref 65–99)
Glucose-Capillary: 133 mg/dL — ABNORMAL HIGH (ref 65–99)
Glucose-Capillary: 134 mg/dL — ABNORMAL HIGH (ref 65–99)
Glucose-Capillary: 138 mg/dL — ABNORMAL HIGH (ref 65–99)
Glucose-Capillary: 154 mg/dL — ABNORMAL HIGH (ref 65–99)
Glucose-Capillary: 155 mg/dL — ABNORMAL HIGH (ref 65–99)

## 2016-06-17 LAB — CBC
HCT: 34.2 % — ABNORMAL LOW (ref 39.0–52.0)
Hemoglobin: 10.8 g/dL — ABNORMAL LOW (ref 13.0–17.0)
MCH: 29.8 pg (ref 26.0–34.0)
MCHC: 31.6 g/dL (ref 30.0–36.0)
MCV: 94.2 fL (ref 78.0–100.0)
Platelets: 154 10*3/uL (ref 150–400)
RBC: 3.63 MIL/uL — ABNORMAL LOW (ref 4.22–5.81)
RDW: 14.4 % (ref 11.5–15.5)
WBC: 15.2 10*3/uL — ABNORMAL HIGH (ref 4.0–10.5)

## 2016-06-17 LAB — BASIC METABOLIC PANEL
Anion gap: 8 (ref 5–15)
BUN: 26 mg/dL — ABNORMAL HIGH (ref 6–20)
CO2: 25 mmol/L (ref 22–32)
Calcium: 8.8 mg/dL — ABNORMAL LOW (ref 8.9–10.3)
Chloride: 102 mmol/L (ref 101–111)
Creatinine, Ser: 1.49 mg/dL — ABNORMAL HIGH (ref 0.61–1.24)
GFR calc Af Amer: 56 mL/min — ABNORMAL LOW (ref >60)
GFR calc non Af Amer: 49 mL/min — ABNORMAL LOW (ref >60)
Glucose, Bld: 149 mg/dL — ABNORMAL HIGH (ref 65–99)
Potassium: 4.1 mmol/L (ref 3.5–5.1)
Sodium: 135 mmol/L (ref 135–145)

## 2016-06-17 NOTE — Progress Notes (Signed)
Walked in hall with Char[employer] fundraiser. Slightly short of breath afterwards, however tolerated well.

## 2016-06-17 NOTE — Progress Notes (Signed)
Patient ID: [name], male   DOB: 11-12-1955, 60 y.o.   MRN: [id number] EVENING ROUNDS NOTE :     [address]       [name] [address]             [phone]                 2 Days Post-Op Procedure(s) (LRB): CORONARY ARTERY BYPASS GRAFTING (CABG)x4(LIMA to LAD, SVG to DIAGONAL, SVG to OM, and SVG to PDA)   WITH ENDOSCOPIC HARVESTING OF LEFT AND RIGHT SAPHENOUS VEINS. (N/A) TRANSESOPHAGEAL ECHOCARDIOGRAM (TEE) (N/A)  Total Length of Stay:  LOS: 8 days  BP (!) 96/58   Pulse 96   Temp 97.7 F (36.5 C) (Oral)   Resp 14   Ht  (1.905 m)   Wt 289 lb 0.4 oz (131.1 kg)   SpO2 94%   BMI 36.13 kg/m   .Intake/Output      04/07 0701 - 04/08 0700 04/08 0701 - 04/09 0700   P.O. 1200 780   I.V. (mL/kg) 466.2 (3.6)    Blood     NG/GT     IV Piggyback 100 50   Total Intake(mL/kg) 1766.2 (13.5) 830 (6.3)   Urine (mL/kg/hr) 1195 (0.4)    Emesis/NG output     Blood     Chest Tube 140 (0)    Total Output 1335     Net +431.2 +830          . sodium chloride Stopped (06/16/16 1000)  . sodium chloride Stopped (06/16/16 1745)  . sodium chloride    . insulin (NOVOLIN-R) infusion Stopped (06/16/16 1415)  . lactated ringers Stopped (06/16/16 1239)  . nitroGLYCERIN 10 mcg/min (06/15/16 1817)  . phenylephrine (NEO-SYNEPHRINE) Adult infusion Stopped (06/15/16 2200)     Lab Results  Component Value Date   WBC 15.2 (H) 06/17/2016   HGB 10.8 (L) 06/17/2016   HCT 34.2 (L) 06/17/2016   PLT 154 06/17/2016   GLUCOSE 149 (H) 06/17/2016   CHOL 222 (H) 06/09/2016   TRIG 273 (H) 06/09/2016   HDL 41 06/09/2016   LDLCALC 126 (H) 06/09/2016   ALT 34 06/14/2016   AST 26 06/14/2016   NA 135 06/17/2016   K 4.1 06/17/2016   CL 102 06/17/2016   CREATININE 1.49 (H) 06/17/2016   BUN 26 (H) 06/17/2016   CO2 25 06/17/2016   TSH 1.764 06/11/2016   INR 1.19 06/15/2016   HGBA1C 6.2 (H) 06/09/2016   Stable day 1195 uop   [name] MD  Beeper [phone] Office  [phone] 06/17/2016 5:53 PM

## 2016-06-17 NOTE — Progress Notes (Signed)
Patient ID: [name], male   DOB: 1955-10-21, 61 y.o.   MRN: [id number] TCTS DAILY ICU PROGRESS NOTE                   [address]            [name] [address]          [phone]   2 Days Post-Op Procedure(s) (LRB): CORONARY ARTERY BYPASS GRAFTING (CABG)x4(LIMA to LAD, SVG to DIAGONAL, SVG to OM, and SVG to PDA)   WITH ENDOSCOPIC HARVESTING OF LEFT AND RIGHT SAPHENOUS VEINS. (N/A) TRANSESOPHAGEAL ECHOCARDIOGRAM (TEE) (N/A)  Total Length of Stay:  LOS: 8 days   Subjective: awake and alert , ambulated around the unit this am  Objective: Vital signs in last 24 hours: Temp:  [97.5 F (36.4 C)-98.7 F (37.1 C)] 97.5 F (36.4 C) (04/08 0800) Pulse Rate:  [85-101] 96 (04/08 1000) Cardiac Rhythm: Atrial paced (04/07 2000) Resp:  [10-29] 17 (04/08 1000) BP: (82-141)/(55-82) 99/81 (04/08 1000) SpO2:  [90 %-99 %] 94 % (04/08 1000) Arterial Line BP: (128)/(63) 128/63 (04/07 1200) Weight:  [289 lb 0.4 oz (131.1 kg)] 289 lb 0.4 oz (131.1 kg) (04/08 0425)  Filed Weights   06/16/16 0600 06/17/16 0424 06/17/16 0425  Weight: 287 lb 7.7 oz (130.4 kg) 289 lb 0.4 oz (131.1 kg) 289 lb 0.4 oz (131.1 kg)    Weight change: 1 lb 8.7 oz (0.7 kg)   Hemodynamic parameters for last 24 hours:    Intake/Output from previous day: 04/07 0701 - 04/08 0700 In: 1766.2 [P.O.:1200; I.V.:466.2; IV Piggyback:100] Out: 1335 [Urine:1195; Chest Tube:140]  Intake/Output this shift: No intake/output data recorded.  Current Meds: Scheduled Meds: . acetaminophen  1,000 mg Oral Q6H   Or  . acetaminophen (TYLENOL) oral liquid 160 mg/5 mL  1,000 mg Per Tube Q6H  . aspirin EC  325 mg Oral Daily   Or  . aspirin  324 mg Per Tube Daily  . atorvastatin  80 mg Oral q1800  . bisacodyl  10 mg Oral Daily   Or  . bisacodyl  10 mg Rectal Daily  . cefUROXime (ZINACEF)  IV  1.5 g Intravenous Q12H  . Chlorhexidine Gluconate Cloth  6 each Topical Daily  . docusate sodium  200 mg Oral Daily  .  enoxaparin (LOVENOX) injection  40 mg Subcutaneous QHS  . escitalopram  10 mg Oral Daily  . insulin aspart  0-24 Units Subcutaneous Q4H  . insulin detemir  15 Units Subcutaneous Daily  . insulin detemir  15 Units Subcutaneous Daily  . insulin regular  0-10 Units Intravenous TID WC  . metoCLOPramide (REGLAN) injection  10 mg Intravenous Q6H  . metoprolol tartrate  12.5 mg Oral BID   Or  . metoprolol tartrate  12.5 mg Per Tube BID  . pantoprazole  40 mg Oral Daily  . sodium chloride flush  10 mL Intravenous Q12H  . sodium chloride flush  3 mL Intravenous Q12H   Continuous Infusions: . sodium chloride Stopped (06/16/16 1000)  . sodium chloride Stopped (06/16/16 1745)  . sodium chloride    . dexmedetomidine (PRECEDEX) IV infusion Stopped (06/15/16 1851)  . DOPamine    . insulin (NOVOLIN-R) infusion Stopped (06/16/16 1415)  . lactated ringers Stopped (06/16/16 1239)  . nitroGLYCERIN 10 mcg/min (06/15/16 1817)  . phenylephrine (NEO-SYNEPHRINE) Adult infusion Stopped (06/15/16 2200)   PRN Meds:.sodium chloride, cyclobenzaprine, lactated ringers, metoprolol, morphine injection, ondansetron (ZOFRAN) IV, oxyCODONE, sodium chloride flush, sodium  chloride flush, traMADol  General appearance: alert and cooperative Neurologic: intact Heart: regular rate and rhythm, S1, S2 normal, no murmur, click, rub or gallop Lungs: diminished breath sounds bibasilar Abdomen: soft, non-tender; bowel sounds normal; no masses,  no organomegaly Extremities: extremities normal, atraumatic, no cyanosis or edema and Homans sign is negative, no sign of DVT Wound: sternum intact  Lab Results: CBC: Recent Labs  06/16/16 0330 06/16/16 1651 06/17/16 0436  WBC 10.2  --  15.2*  HGB 10.9* 12.2* 10.8*  HCT 33.8* 36.0* 34.2*  PLT 126*  --  154   BMET:  Recent Labs  06/16/16 0357 06/16/16 1651 06/16/16 1700 06/17/16 0436  NA 140 139  --  135  K 4.1 4.6  --  4.1  CL 106 102  --  102  CO2 24  --   --  25    GLUCOSE 111* 146*  --  149*  BUN 14 19  --  26*  CREATININE 0.95 1.30* 1.30* 1.49*  CALCIUM 8.5*  --   --  8.8*    CMET: Lab Results  Component Value Date   WBC 15.2 (H) 06/17/2016   HGB 10.8 (L) 06/17/2016   HCT 34.2 (L) 06/17/2016   PLT 154 06/17/2016   GLUCOSE 149 (H) 06/17/2016   CHOL 222 (H) 06/09/2016   TRIG 273 (H) 06/09/2016   HDL 41 06/09/2016   LDLCALC 126 (H) 06/09/2016   ALT 34 06/14/2016   AST 26 06/14/2016   NA 135 06/17/2016   K 4.1 06/17/2016   CL 102 06/17/2016   CREATININE 1.49 (H) 06/17/2016   BUN 26 (H) 06/17/2016   CO2 25 06/17/2016   TSH 1.764 06/11/2016   INR 1.19 06/15/2016   HGBA1C 6.2 (H) 06/09/2016      PT/INR:  Recent Labs  06/15/16 1520  LABPROT 15.2  INR 1.19   Radiology: Dg Chest Port 1 View  Result Date: 06/17/2016 CLINICAL DATA:  Postop CABG EXAM: PORTABLE CHEST 1 VIEW COMPARISON:  Chest x-rays dated 06/16/2016 and 06/15/2016. FINDINGS: Swan-Ganz catheter and mediastinal drains have been removed. Left-sided chest tube remains in place. Cardiomediastinal silhouette is grossly stable in size and configuration. Probable mild atelectasis and/or small pleural effusion at the left lung base. Lungs appear otherwise clear. No pneumothorax seen. IMPRESSION: 1. Probable atelectasis and/or small pleural effusion at the left lung base. Lungs are otherwise clear. 2. Left-sided chest tube is stable in position. Electronically Signed   By: Bary Richard M.D.   On: 06/17/2016 07:04     Assessment/Plan: S/P Procedure(s) (LRB): CORONARY ARTERY BYPASS GRAFTING (CABG)x4(LIMA to LAD, SVG to DIAGONAL, SVG to OM, and SVG to PDA)   WITH ENDOSCOPIC HARVESTING OF LEFT AND RIGHT SAPHENOUS VEINS. (N/A) TRANSESOPHAGEAL ECHOCARDIOGRAM (TEE) (N/A) Mobilize Diuresis Diabetes control d/c tubes/lines Mild elevation of cr 1.3 to 1.49, monitor - not enough to classify as kidney disease  B    [name] 06/17/2016 11:23 AM

## 2016-06-18 ENCOUNTER — Encounter (HOSPITAL_COMMUNITY): Payer: Self-pay | Admitting: Cardiothoracic Surgery

## 2016-06-18 ENCOUNTER — Inpatient Hospital Stay (HOSPITAL_COMMUNITY): Payer: Non-veteran care

## 2016-06-18 LAB — GLUCOSE, CAPILLARY
Glucose-Capillary: 110 mg/dL — ABNORMAL HIGH (ref 65–99)
Glucose-Capillary: 89 mg/dL (ref 65–99)
Glucose-Capillary: 102 mg/dL — ABNORMAL HIGH (ref 65–99)
Glucose-Capillary: 94 mg/dL (ref 65–99)
Glucose-Capillary: 95 mg/dL (ref 65–99)
Glucose-Capillary: 118 mg/dL — ABNORMAL HIGH (ref 65–99)

## 2016-06-18 LAB — BASIC METABOLIC PANEL
Anion gap: 8 (ref 5–15)
BUN: 23 mg/dL — ABNORMAL HIGH (ref 6–20)
CO2: 27 mmol/L (ref 22–32)
Calcium: 8.4 mg/dL — ABNORMAL LOW (ref 8.9–10.3)
Chloride: 102 mmol/L (ref 101–111)
Creatinine, Ser: 1.17 mg/dL (ref 0.61–1.24)
GFR calc Af Amer: >60 mL/min (ref >60)
GFR calc non Af Amer: >60 mL/min (ref >60)
Glucose, Bld: 105 mg/dL — ABNORMAL HIGH (ref 65–99)
Potassium: 3.6 mmol/L (ref 3.5–5.1)
Sodium: 137 mmol/L (ref 135–145)

## 2016-06-18 LAB — CBC
HCT: 29.6 % — ABNORMAL LOW (ref 39.0–52.0)
Hemoglobin: 9.4 g/dL — ABNORMAL LOW (ref 13.0–17.0)
MCH: 29.8 pg (ref 26.0–34.0)
MCHC: 31.8 g/dL (ref 30.0–36.0)
MCV: 94 fL (ref 78.0–100.0)
Platelets: 142 10*3/uL — ABNORMAL LOW (ref 150–400)
RBC: 3.15 MIL/uL — ABNORMAL LOW (ref 4.22–5.81)
RDW: 14.4 % (ref 11.5–15.5)
WBC: 10.7 10*3/uL — ABNORMAL HIGH (ref 4.0–10.5)

## 2016-06-18 MED ORDER — SODIUM CHLORIDE 0.9% FLUSH: 3.0000 mL | INTRAVENOUS | Status: DC | PRN

## 2016-06-18 MED ORDER — INSULIN ASPART 100 UNIT/ML ~~LOC~~ SOLN: 0.0000 [IU] | Freq: Three times a day (TID) | SUBCUTANEOUS | Status: DC

## 2016-06-18 MED ORDER — SODIUM CHLORIDE 0.9 % IV SOLN: 250.0000 mL | INTRAVENOUS | Status: DC | PRN

## 2016-06-18 MED ORDER — POTASSIUM CHLORIDE 20 MEQ/15ML (10%) PO SOLN
20.0000 meq | Freq: Four times a day (QID) | ORAL | Status: AC
  Administered 2016-06-18: 20 meq via ORAL
  Filled 2016-06-18 (×2): qty 15

## 2016-06-18 MED ORDER — METOPROLOL TARTRATE 12.5 MG HALF TABLET
12.5000 mg | ORAL_TABLET | Freq: Two times a day (BID) | ORAL | Status: DC
  Administered 2016-06-18 – 2016-06-20 (×5): 12.5 mg via ORAL
  Filled 2016-06-18 (×5): qty 1

## 2016-06-18 MED ORDER — MOVING RIGHT ALONG BOOK
Freq: Once | Status: AC
  Administered 2016-06-18: 14:00:00
  Filled 2016-06-18 (×2): qty 1

## 2016-06-18 MED ORDER — POTASSIUM CHLORIDE CRYS ER 20 MEQ PO TBCR
20.0000 meq | EXTENDED_RELEASE_TABLET | Freq: Four times a day (QID) | ORAL | Status: AC
  Administered 2016-06-18: 20 meq via ORAL
  Filled 2016-06-18: qty 1

## 2016-06-18 MED ORDER — SODIUM CHLORIDE 0.9% FLUSH
3.0000 mL | Freq: Two times a day (BID) | INTRAVENOUS | Status: DC
  Administered 2016-06-18 – 2016-06-19 (×2): 3 mL via INTRAVENOUS

## 2016-06-18 MED ORDER — ACETAMINOPHEN 325 MG PO TABS: 650.0000 mg | ORAL_TABLET | Freq: Four times a day (QID) | ORAL | Status: DC | PRN

## 2016-06-18 MED FILL — Sodium Chloride IV Soln 0.9%: INTRAVENOUS | Qty: 2000 | Status: AC

## 2016-06-18 MED FILL — Heparin Sodium (Porcine) Inj 1000 Unit/ML: INTRAMUSCULAR | Qty: 10 | Status: AC

## 2016-06-18 MED FILL — Electrolyte-R (PH 7.4) Solution: INTRAVENOUS | Qty: 3000 | Status: AC

## 2016-06-18 MED FILL — Heparin Sodium (Porcine) Inj 1000 Unit/ML: INTRAMUSCULAR | Qty: 30 | Status: AC

## 2016-06-18 MED FILL — Lidocaine HCl IV Inj 20 MG/ML: INTRAVENOUS | Qty: 5 | Status: AC

## 2016-06-18 MED FILL — Mannitol IV Soln 20%: INTRAVENOUS | Qty: 500 | Status: AC

## 2016-06-18 MED FILL — Sodium Bicarbonate IV Soln 8.4%: INTRAVENOUS | Qty: 50 | Status: AC

## 2016-06-18 NOTE — Progress Notes (Addendum)
TCTS DAILY ICU PROGRESS NOTE                   [address]            [place] [address]          [phone]   3 Days Post-Op Procedure(s) (LRB): CORONARY ARTERY BYPASS GRAFTING (CABG)x4(LIMA to LAD, SVG to DIAGONAL, SVG to OM, and SVG to PDA)   WITH ENDOSCOPIC HARVESTING OF LEFT AND RIGHT SAPHENOUS VEINS. (N/A) TRANSESOPHAGEAL ECHOCARDIOGRAM (TEE) (N/A)  Total Length of Stay:  LOS: 9 days   Subjective: Feels bored. He states that he is not used to sitting for this long.   Objective: Vital signs in last 24 hours: Temp:  [97.7 F (36.5 C)-98.5 F (36.9 C)] 97.7 F (36.5 C) (04/09 0851) Pulse Rate:  [84-106] 106 (04/09 1000) Cardiac Rhythm: Normal sinus rhythm (04/09 0800) Resp:  [12-22] 19 (04/09 1000) BP: (81-125)/(49-73) 103/68 (04/09 0900) SpO2:  [90 %-100 %] 97 % (04/09 1000) Weight:  [130.1 kg (286 lb 13.1 oz)] 130.1 kg (286 lb 13.1 oz) (04/09 0600)  Filed Weights   06/17/16 0424 06/17/16 0425 06/18/16 0600  Weight: 131.1 kg (289 lb 0.4 oz) 131.1 kg (289 lb 0.4 oz) 130.1 kg (286 lb 13.1 oz)    Weight change: -1 kg (-2 lb 3.3 oz)      Intake/Output from previous day: 04/08 0701 - 04/09 0700 In: 1010 [P.O.:960; IV Piggyback:50] Out: 750 [Urine:750]  Intake/Output this shift: Total I/O In: 120 [P.O.:120] Out: -   Current Meds: Scheduled Meds: . acetaminophen  1,000 mg Oral Q6H   Or  . acetaminophen (TYLENOL) oral liquid 160 mg/5 mL  1,000 mg Per Tube Q6H  . aspirin EC  325 mg Oral Daily   Or  . aspirin  324 mg Per Tube Daily  . atorvastatin  80 mg Oral q1800  . bisacodyl  10 mg Oral Daily   Or  . bisacodyl  10 mg Rectal Daily  . Chlorhexidine Gluconate Cloth  6 each Topical Daily  . docusate sodium  200 mg Oral Daily  . enoxaparin (LOVENOX) injection  40 mg Subcutaneous QHS  . escitalopram  10 mg Oral Daily  . insulin aspart  0-24 Units Subcutaneous Q4H  . insulin detemir  15 Units Subcutaneous Daily  . metoCLOPramide (REGLAN)  injection  10 mg Intravenous Q6H  . metoprolol tartrate  12.5 mg Oral BID  . pantoprazole  40 mg Oral Daily  . potassium chloride  20 mEq Oral Q6H   Or  . potassium chloride  20 mEq Oral Q6H  . sodium chloride flush  10 mL Intravenous Q12H  . sodium chloride flush  3 mL Intravenous Q12H   Continuous Infusions: . sodium chloride Stopped (06/16/16 1000)  . sodium chloride Stopped (06/16/16 1745)  . sodium chloride    . lactated ringers Stopped (06/16/16 1239)  . nitroGLYCERIN 10 mcg/min (06/15/16 1817)  . phenylephrine (NEO-SYNEPHRINE) Adult infusion Stopped (06/15/16 2200)   PRN Meds:.sodium chloride, cyclobenzaprine, lactated ringers, metoprolol, morphine injection, ondansetron (ZOFRAN) IV, oxyCODONE, sodium chloride flush, sodium chloride flush, traMADol  General appearance: alert, cooperative and no distress Heart: regular rate and rhythm, S1, S2 normal, no murmur, click, rub or gallop Lungs: clear to auscultation bilaterally Abdomen: soft, non-tender; bowel sounds normal; no masses,  no organomegaly Extremities: extremities normal, atraumatic, no cyanosis or edema Wound: EVH sites healing well. Some blood tinged drainaged on the distal aspect of his sternal incision.  Lab Results: CBC: Recent Labs  06/17/16 0436 06/18/16 0400  WBC 15.2* 10.7*  HGB 10.8* 9.4*  HCT 34.2* 29.6*  PLT 154 142*   BMET:  Recent Labs  06/17/16 0436 06/18/16 0400  NA 135 137  K 4.1 3.6  CL 102 102  CO2 25 27  GLUCOSE 149* 105*  BUN 26* 23*  CREATININE 1.49* 1.17  CALCIUM 8.8* 8.4*    CMET: Lab Results  Component Value Date   WBC 10.7 (H) 06/18/2016   HGB 9.4 (L) 06/18/2016   HCT 29.6 (L) 06/18/2016   PLT 142 (L) 06/18/2016   GLUCOSE 105 (H) 06/18/2016   CHOL 222 (H) 06/09/2016   TRIG 273 (H) 06/09/2016   HDL 41 06/09/2016   LDLCALC 126 (H) 06/09/2016   ALT 34 06/14/2016   AST 26 06/14/2016   NA 137 06/18/2016   K 3.6 06/18/2016   CL 102 06/18/2016   CREATININE 1.17  06/18/2016   BUN 23 (H) 06/18/2016   CO2 27 06/18/2016   TSH 1.764 06/11/2016   INR 1.19 06/15/2016   HGBA1C 6.2 (H) 06/09/2016      PT/INR:  Recent Labs  06/15/16 1520  LABPROT 15.2  INR 1.19   Radiology: Dg Chest Port 1 View  Result Date: 06/18/2016 CLINICAL DATA:  Status post CABG on 15 June 2016 EXAM: PORTABLE CHEST 1 VIEW COMPARISON:  Portable chest x-ray of 17 June 2016 FINDINGS: The lungs are better inflated today. The interstitial markings are less prominent. Interval improvement in left lower lobe atelectasis. No pleural effusion or pneumothorax. The cardiac silhouette remains enlarged. The right internal jugular venous catheter tip projects over the mid SVC. IMPRESSION: Further interval improvement in the appearance of the pulmonary interstitium. Decreased left lower lobe atelectasis. Stable cardiomegaly without pulmonary vascular congestion. Electronically Signed   By: David  [place] M.D.   On: 06/18/2016 07:11     Assessment/Plan: S/P Procedure(s) (LRB): CORONARY ARTERY BYPASS GRAFTING (CABG)x4(LIMA to LAD, SVG to DIAGONAL, SVG to OM, and SVG to PDA)   WITH ENDOSCOPIC HARVESTING OF LEFT AND RIGHT SAPHENOUS VEINS. (N/A) TRANSESOPHAGEAL ECHOCARDIOGRAM (TEE) (N/A)  1. CV-sinus tachy rate 100s. Maps in the 70s. Continue low-dose BB. Off pressors.  2. Pulm- tolerating room air with good oxygen saturation. CXR showed interval improvement in the appearance of pulmonary interstitium, Decreased left lower lobe atelectasis. Stable cardiomegaly without pulmonary vascular congestion. Chest tubes removed. 3. Renal-creatinine came down from 1.49 to 1.17. Electrolytes okay.  4. H and H went down from 10.8/34.2 to 9.4/29.6. Will trend. No overt bleeding.  5. Endo-well controlled blood glucose level 6. Lovenox for DVT proph  Plan: Transfer to step down. Discontinue central line. Keep pacing wires for now. Encouraged incentive spirometer. Ambulate TID.     Sharlene Dory 06/18/2016 10:50  AM   Stable, ambulating around unit to 2w I have seen and examined [name] and agree with the above assessment  and plan.  [name] MD Beeper [phone] Office [phone] 06/18/2016 2:55 PM

## 2016-06-18 NOTE — Op Note (Signed)
[name], [name]                ACCOUNT NO.:  [id number]  MEDICAL RECORD NO.:  [id number]  LOCATION:  3W24C                        FACILITY:  [facility]  PHYSICIAN:  Sheliah Plane, MD    DATE OF BIRTH:  1955/07/27  DATE OF PROCEDURE:  06/15/2016 DATE OF DISCHARGE:                              OPERATIVE REPORT   PREOPERATIVE DIAGNOSIS:  Recent admission for non-ST elevation myocardial infarction.  POSTOPERATIVE DIAGNOSIS:  Recent admission for non-ST elevation myocardial infarction.  SURGICAL PROCEDURE:  Coronary artery bypass grafting x4 with the left internal mammary to the left anterior descending coronary artery, reverse saphenous vein graft to the diagonal coronary artery, reverse saphenous vein graft to the obtuse marginal coronary artery, and reverse saphenous vein graft to the posterior descending coronary artery with left leg endo vein harvesting of the greater saphenous vein and calf and right thigh greater saphenous vein endo vein harvesting.  SURGEON:  Sheliah Plane, MD.  FIRST ASSISTANT:  Doree Fudge, PA.  SECOND ASSISTANT:  Jari Favre, PA.  BRIEF HISTORY:  The patient is a 61 year old male with mildly elevated hemoglobin A1c, who presented with episode of new onset prolonged chest pain and elevated troponin.  The patient was stabilized medically.  He had been loaded with Brilinta and given Brilinta for several days before referral for cardiac surgery.  The patient had complex LAD diagonal disease of greater than 80% and disease in obtuse marginal of 80%.  The right coronary artery had luminal irregularities, but no high-grade stenosis.  Overall, ventricular function was preserved, coronary artery bypass grafting was recommended.  The patient agreed and signed informed consent.  DESCRIPTION OF PROCEDURE:  With Swan-Ganz and arterial line monitors in place, the patient underwent general endotracheal anesthesia without incident.  The skin and chest and  legs were prepped with Betadine and draped in usual sterile manner.  Appropriate time-out was performed.  We then proceeded with left thigh and calf endo vein harvesting.  The proximal most portion of this vein was suitable; however, in the lower leg vein, there was portion that was sclerotic and not usable. Additional vein was harvested from the right greater saphenous thigh. Median sternotomy was performed.  Left internal mammary artery was dissected down as a pedicle graft.  The distal artery was divided, had good free flow.  Pericardium was opened.  Overall, ventricular function appeared reserved.  The patient was systemically heparinized.  Ascending aorta was cannulated.  The right atrium was cannulated.  An aortic root vent cardioplegia needle was introduced into the ascending aorta.  The patient was placed on cardiopulmonary bypass 2.4 L/min/m2.  A 22-French aortic cannula was used because of the patient's large body surface area of 2.54.  Sites of anastomosis were dissected out of the epicardium. The patient's body temperature was cooled to 32 degrees.  Aortic crossclamp was applied and 500 mL of cold blood potassium cardioplegia was administered with diastolic arrest of the heart.  Myocardial septal temperature was monitored throughout the crossclamp.  Attention was turned first to the obtuse marginal coronary artery, which was approximately 1.2 mm in size using a running 7-0 Prolene.  The distal anastomosis was performed with a segment of reverse saphenous  vein graft.  The diagonal coronary artery was good.  Diagonal coronary artery was then opened and was admitted a 1.5 mm probe.  Using a running 7-0 Prolene, distal anastomosis was performed.  Attention was then turned to the posterior descending coronary artery.  The distal right coronary artery was diffusely diseased and calcified; however, the posterior descending was of reasonable size admitting a 1.5 mm probe  proximally and distally and without significant calcification.  Vessel was opened and a segment of reverse saphenous vein graft was anastomosed to the proximal posterior descending coronary artery.  Attention was then turned to the left anterior descending coronary artery, which was opened and was a relatively small vessel.  Considering the patient's size, 1 mm probe passed distally.  Vessel was 1.2-1.3 mm in size.  Using a running 8-0 Prolene, left internal mammary artery was anastomosed to the left anterior descending coronary artery.  Fascia was tacked to the epicardium with crossclamp still in place, three punch aortotomies were performed and each of 3 vein grafts anastomosed to the ascending aorta. Bulldog was removed from the mammary artery with rise in myocardial septal temperature.  The heart was allowed to passively fill and de-air. The aortic crossclamp was removed.  Total cross-clamp time of 98 minutes.  The patient spontaneously converted to a sinus rhythm.  Sites of anastomosis were inspected and free of bleeding.  The patient was then ventilated and weaned from cardiopulmonary bypass on low-dose dopamine.  He remained hemodynamically stable.  He was decannulated in usual fashion.  Protamine sulfate was administered with operative field hemostatic.  Atrial and ventricular pacing wires were applied.  Graft markers were applied.  Left pleural tube and a Blake mediastinal drain were left in place.  Pericardium was loosely reapproximated.  Sternum closed with #6 stainless steel wire.  Fascia was closed with interrupted 0 Vicryl and 3-0 Vicryl in subcutaneous tissue and 4-0 subcuticular stitch in skin edges.  Dry dressings were applied.  Sponge and needle counts were reported correct after completion of procedure.  The patient tolerated procedure without obvious complication.  Total pump time was 146 minutes.  The patient did not require any blood bank blood products during the  operative procedure.     Sheliah Plane, MD     EG/MEDQ  D:  06/17/2016  T:  06/18/2016  Job:  [id number]

## 2016-06-18 NOTE — Progress Notes (Signed)
Subjective:  Denies SSCP, palpitations or Dyspnea Has ambulated around unit.   Objective:  Vitals:   06/18/16 0400 06/18/16 0500 06/18/16 0600 06/18/16 0700  BP: 117/71 119/64 117/65 123/70  Pulse: 92 88 91 95  Resp: Temp: 98.5 F (36.9 C)     TempSrc: Oral     SpO2: 97% 98% 95% 92%  Weight:   286 lb 13.1 oz (130.1 kg)   Height:        Intake/Output from previous day:  Intake/Output Summary (Last 24 hours) at 06/18/16 0817 Last data filed at 06/18/16 0600  Gross per 24 hour  Intake              770 ml  Output              500 ml  Net              270 ml    Physical Exam: Affect appropriate Overweight white male  HEENT: right IJ  Neck supple with no adenopathy JVP normal no bruits no thyromegaly Lungs clear with no wheezing and good diaphragmatic motion Heart:  S1/S2 no murmur, no rub, gallop or click PMI normal post sternotomy  Abdomen: benighn, BS positve, no tenderness, no AAA no bruit.  No HSM or HJR Distal pulses intact with no bruits No edema Neuro non-focal Skin warm and dry No muscular weakness   Lab Results: Basic Metabolic Panel:  Recent Labs  16/10/96 0357  06/16/16 1700 06/17/16 0436 06/18/16 0400  NA 140  < >  --  135 137  K 4.1  < >  --  4.1 3.6  CL 106  < >  --  102 102  CO2 24  --   --  25 27  GLUCOSE 111*  < >  --  149* 105*  BUN 14  < >  --  26* 23*  CREATININE 0.95  < > 1.30* 1.49* 1.17  CALCIUM 8.5*  --   --  8.8* 8.4*  MG 2.2  --  2.2  --   --   < > = values in this interval not displayed. CBC:  Recent Labs  06/17/16 0436 06/18/16 0400  WBC 15.2* 10.7*  HGB 10.8* 9.4*  HCT 34.2* 29.6*  MCV 94.2 94.0  PLT 154 142*    Imaging: Dg Chest Port 1 View  Result Date: 06/18/2016 CLINICAL DATA:  Status post CABG on 15 June 2016 EXAM: PORTABLE CHEST 1 VIEW COMPARISON:  Portable chest x-ray of 17 June 2016 FINDINGS: The lungs are better inflated today. The interstitial markings are less prominent. Interval  improvement in left lower lobe atelectasis. No pleural effusion or pneumothorax. The cardiac silhouette remains enlarged. The right internal jugular venous catheter tip projects over the mid SVC. IMPRESSION: Further interval improvement in the appearance of the pulmonary interstitium. Decreased left lower lobe atelectasis. Stable cardiomegaly without pulmonary vascular congestion. Electronically Signed   By: David  [place] M.D.   On: 06/18/2016 07:11   Dg Chest Port 1 View  Result Date: 06/17/2016 CLINICAL DATA:  Postop CABG EXAM: PORTABLE CHEST 1 VIEW COMPARISON:  Chest x-rays dated 06/16/2016 and 06/15/2016. FINDINGS: Swan-Ganz catheter and mediastinal drains have been removed. Left-sided chest tube remains in place. Cardiomediastinal silhouette is grossly stable in size and configuration. Probable mild atelectasis and/or small pleural effusion at the left lung base. Lungs appear otherwise clear. No pneumothorax seen. IMPRESSION: 1. Probable atelectasis and/or small pleural effusion at the  left lung base. Lungs are otherwise clear. 2. Left-sided chest tube is stable in position. Electronically Signed   By: Bary Richard M.D.   On: 06/17/2016 07:04    Cardiac Studies:  ECG: SR rate 93 ICRBBB old IMI pericardial changes    Telemetry:  NSR rates 90's   Echo: 06/09/16 personally reviewed EF 45-50%   Medications:   . acetaminophen  1,000 mg Oral Q6H   Or  . acetaminophen (TYLENOL) oral liquid 160 mg/5 mL  1,000 mg Per Tube Q6H  . aspirin EC  325 mg Oral Daily   Or  . aspirin  324 mg Per Tube Daily  . atorvastatin  80 mg Oral q1800  . bisacodyl  10 mg Oral Daily   Or  . bisacodyl  10 mg Rectal Daily  . Chlorhexidine Gluconate Cloth  6 each Topical Daily  . docusate sodium  200 mg Oral Daily  . enoxaparin (LOVENOX) injection  40 mg Subcutaneous QHS  . escitalopram  10 mg Oral Daily  . insulin aspart  0-24 Units Subcutaneous Q4H  . insulin detemir  15 Units Subcutaneous Daily  . insulin detemir   15 Units Subcutaneous Daily  . metoCLOPramide (REGLAN) injection  10 mg Intravenous Q6H  . metoprolol tartrate  12.5 mg Oral BID   Or  . metoprolol tartrate  12.5 mg Per Tube BID  . pantoprazole  40 mg Oral Daily  . potassium chloride  20 mEq Oral Q6H   Or  . potassium chloride  20 mEq Oral Q6H  . sodium chloride flush  10 mL Intravenous Q12H  . sodium chloride flush  3 mL Intravenous Q12H     . sodium chloride Stopped (06/16/16 1000)  . sodium chloride Stopped (06/16/16 1745)  . sodium chloride    . lactated ringers Stopped (06/16/16 1239)  . nitroGLYCERIN 10 mcg/min (06/15/16 1817)  . phenylephrine (NEO-SYNEPHRINE) Adult infusion Stopped (06/15/16 2200)    Assessment/Plan:  CAD/CABG:  Wounds healing well  Post op day 3 LIMA to LAD SVG  To D1 SVG OM SVG PDA Preop EF mildly reduced add low dose beta blocker will f/u with me post d/c .   DM:  Discussed low carb diet.  Target hemoglobin A1c is 6.5 or less.  Continue current medications.  Chol:  On statin target  LDL 70 or less   Charlton Haws 06/18/2016, 8:17 AM

## 2016-06-19 DIAGNOSIS — Z951 Presence of aortocoronary bypass graft: Secondary | ICD-10-CM

## 2016-06-19 HISTORY — DX: Presence of aortocoronary bypass graft: Z95.1

## 2016-06-19 LAB — BASIC METABOLIC PANEL
ANION GAP: 8 (ref 5–15)
BUN: 19 mg/dL (ref 6–20)
CALCIUM: 8.6 mg/dL — AB (ref 8.9–10.3)
CO2: 27 mmol/L (ref 22–32)
Chloride: 106 mmol/L (ref 101–111)
Creatinine, Ser: 1.03 mg/dL (ref 0.61–1.24)
GLUCOSE: 105 mg/dL — AB (ref 65–99)
Potassium: 4 mmol/L (ref 3.5–5.1)
SODIUM: 141 mmol/L (ref 135–145)

## 2016-06-19 LAB — GLUCOSE, CAPILLARY
Glucose-Capillary: 87 mg/dL (ref 65–99)
Glucose-Capillary: 87 mg/dL (ref 65–99)
Glucose-Capillary: 89 mg/dL (ref 65–99)
Glucose-Capillary: 107 mg/dL — ABNORMAL HIGH (ref 65–99)

## 2016-06-19 LAB — CBC
HCT: 29 % — ABNORMAL LOW (ref 39.0–52.0)
Hemoglobin: 9.4 g/dL — ABNORMAL LOW (ref 13.0–17.0)
MCH: 30.1 pg (ref 26.0–34.0)
MCHC: 32.4 g/dL (ref 30.0–36.0)
MCV: 92.9 fL (ref 78.0–100.0)
PLATELETS: 177 10*3/uL (ref 150–400)
RBC: 3.12 MIL/uL — ABNORMAL LOW (ref 4.22–5.81)
RDW: 14.7 % (ref 11.5–15.5)
WBC: 9.2 10*3/uL (ref 4.0–10.5)

## 2016-06-19 NOTE — Progress Notes (Signed)
CARDIAC REHAB PHASE I   PRE:  Rate/Rhythm: 85 SR  BP:  Sitting: 133/72        SaO2: 98 RA  MODE:  Ambulation: 450 ft   POST:  Rate/Rhythm: 98 SR  BP:  Sitting: 138/74         SaO2: 98 RA  Pt ambulated 450 ft on RA, hand held assist, steady gait, tolerated well. Pt c/o feeling "a little winded," denies any other complaints, standing rest x2. Encouraged additional ambulation x2 today with family or staff. Pt to recliner after walk, call bell within reach. Will follow.   1100-1130 Joylene Grapes, RN, BSN 06/19/2016 11:28 AM

## 2016-06-19 NOTE — Discharge Summary (Signed)
Physician Discharge Summary  Patient ID: [name] MRN: [id number] DOB/AGE: 63/19/1957 63 y.o.  Admit date: 06/09/2016 Discharge date: 06/19/2016  Admission Diagnoses:  Patient Active Problem List   Diagnosis Date Noted  . Coronary artery disease 06/15/2016  . Coronary artery disease involving native heart without angina pectoris   . NSTEMI (non-ST elevated myocardial infarction) (HCC) 06/09/2016  . Chest pain 06/09/2016  . Obesity 06/09/2016   Discharge Diagnoses:   Patient Active Problem List   Diagnosis Date Noted  . 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 Coronary artery disease 06/15/2016  . Coronary artery disease involving native heart without angina pectoris   . NSTEMI (non-ST elevated myocardial infarction) (HCC) 06/09/2016  . Chest pain 06/09/2016  . Obesity 06/09/2016   Discharged Condition: good  History of Present Illness:  Mr. [name] is a 61 yo male with history of obesity and anxiety.  He presented to [facility] on 06/09/2016 with right sided arm pain and chest pain.  The pain first started when he was helping a client move heavy equipment.  He noticed the right arm pain and thought he pulled a muscle.  The pain progressed and he developed chest pain prompting him to present to the ED.  He presented to a hospital in [place] initially, but was later transferred to [facility] for further care.  Cardiac catheterization was done and showed multivessel CAD with decreased EF of 45-50%.  It was felt coronary bypass grafting would be indicated and TCTS consult was placed.   Hospital Course:   He was evaluated by Dr. Tyrone Sage who was in agreement the patient would require coronary bypass grafting.  The risks and benefits of the procedure were explained to the patient and he was agreeable to proceed.  However, he was loaded with Brilinta and would require washout prior to proceeding with surgery.  The patient remained chest pain free prior to surgery.  He was taken to the  operating room on 06/15/2016.  He underwent CABG x 4 utilizing LIMA to LAD, SVG to Diagonal, SVG to OM, and SVG to PDA.  He also underwent endoscopic harvest of greater saphenous vein from right and left leg.  He tolerated the procedure without difficulty and was taken to the SICU in stable condition.  The patient was extubated the evening of surgery.  During his stay in the SICU the patient was weaned off Neo Synephrine as tolerated.  His chest tubes and arterial lines were removed without difficulty.  He was maintaining NSR and transferred to the telemetry unit in stable condition.  He continues to do well.  He continues to maintain NSR and his pacing wires have been removed without difficulty.  He is ambulating without difficulty.  His pain is well controlled.  He is tolerating a cardiac diet.  He is felt medically stable for discharge home today.         Significant Diagnostic Studies: angiography:   1. 3-vessel coronary artery disease, including 80% ostial and 90% mid LAD lesions. The mid LAD stenosis is a complex lesion involving ostium of a moderate-caliber first diagonal ([place] 1,1,1). 60% moderate-caliber OM1 as well as 60-70% proximal and 40% mid RCA lesions are also present. 2. Mildly reduced left ventricular contraction with apical anterior and apical hypokinesis. 3. Moderately elevated left ventricular filling pressure.  Treatments: surgery:   Coronary artery bypass grafting x4 with the left internal mammary to the left anterior descending coronary artery, reverse saphenous vein graft to the diagonal coronary  artery, reverse saphenous vein graft to the obtuse marginal coronary artery, and reverse saphenous vein graft to the posterior descending coronary artery with left leg endo vein harvesting of the greater saphenous vein and calf and right thigh greater saphenous vein endo vein harvesting.   Disposition: 01-Home or Self Care   Discharge Medications:  The patient has been discharged  on:   1.Beta Blocker:  Yes [ x  ]                              No   [   ]                              If No, reason:  2.Ace Inhibitor/ARB: Yes [   ]                                     No  [ x   ]                                     If No, reason: titration of beta blocker  3.Statin:   Yes [ x  ]                  No  [   ]                  If No, reason:  4.Ecasa:  Yes  [ x  ]                  No   [   ]                  If No, reason:     Allergies as of 06/20/2016      Reactions   No Known Allergies       Medication List    TAKE these medications   aspirin 325 MG EC tablet Take 1 tablet (325 mg total) by mouth daily.   atorvastatin 80 MG tablet Commonly known as:  LIPITOR Take 1 tablet (80 mg total) by mouth daily at 6 PM.   escitalopram 10 MG tablet Commonly known as:  LEXAPRO Take 10 mg by mouth daily.   metoprolol tartrate 25 MG tablet Commonly known as:  LOPRESSOR Take 0.5 tablets (12.5 mg total) by mouth 2 (two) times daily.   Oxycodone HCl 10 MG Tabs Take 1 tablet (10 mg total) by mouth every 6 (six) hours as needed for severe pain.      Follow-up Information    [name], MD Follow up on 07/16/2016.   Specialty:  Cardiothoracic Surgery Why:  Appointment is at 1:00, please get CXR at 12:30, at [facility] located on first floor of our office building Contact information: [address] [place] [address] [phone]        Cline Crock, PA-C Follow up on 07/05/2016.   Specialties:  Cardiology, Radiology Why:  Appointment is at 1:30 Contact information: [address] [place] [address] [phone]        [name]., MD. Call in 1 day(s).   Specialty:  Family Medicine Contact information: [address]  [place] [address] [phone]           Signed: Sharlene Dory 06/20/2016, 8:04 AM

## 2016-06-19 NOTE — Progress Notes (Addendum)
      [address]       [place] [address]             [phone]      4 Days Post-Op Procedure(s) (LRB): CORONARY ARTERY BYPASS GRAFTING (CABG)x4(LIMA to LAD, SVG to DIAGONAL, SVG to OM, and SVG to PDA)   WITH ENDOSCOPIC HARVESTING OF LEFT AND RIGHT SAPHENOUS VEINS. (N/A) TRANSESOPHAGEAL ECHOCARDIOGRAM (TEE) (N/A) Subjective: No issues overnight. Feels good this morning.   Objective: Vital signs in last 24 hours: Temp:  [97.7 F (36.5 C)-99 F (37.2 C)] 98.4 F (36.9 C) (04/10 0346) Pulse Rate:  [88-106] 97 (04/10 0346) Cardiac Rhythm: Normal sinus rhythm;Bundle branch block (04/09 1900) Resp:  [5-25] 18 (04/10 0346) BP: (103-120)/(57-69) 118/68 (04/10 0346) SpO2:  [93 %-97 %] 97 % (04/10 0346) Weight:  [129.3 kg (285 lb 1.6 oz)] 129.3 kg (285 lb 1.6 oz) (04/10 0346)     Intake/Output from previous day: 04/09 0701 - 04/10 0700 In: 360 [P.O.:360] Out: 525 [Urine:525] Intake/Output this shift: No intake/output data recorded.  General appearance: alert, cooperative and no distress Heart: regular rate and rhythm, S1, S2 normal, no murmur, click, rub or gallop Lungs: clear to auscultation bilaterally Abdomen: soft, non-tender; bowel sounds normal; no masses,  no organomegaly Extremities: extremities normal, atraumatic, no cyanosis or edema Wound: clean and dry with small amount of drainage on the distal end of the sternal incision  Lab Results:  Recent Labs  06/18/16 0400 06/19/16 0339  WBC 10.7* 9.2  HGB 9.4* 9.4*  HCT 29.6* 29.0*  PLT 142* 177   BMET:  Recent Labs  06/18/16 0400 06/19/16 0339  NA 137 141  K 3.6 4.0  CL 102 106  CO2 27 27  GLUCOSE 105* 105*  BUN 23* 19  CREATININE 1.17 1.03  CALCIUM 8.4* 8.6*    PT/INR: No results for input(s): LABPROT, INR in the last 72 hours. ABG    Component Value Date/Time   PHART 7.388 06/16/2016 0343   HCO3 23.3 06/16/2016 0343   TCO2 27 06/16/2016 1651   ACIDBASEDEF 2.0 06/16/2016 0343    O2SAT 98.0 06/16/2016 0343   CBG (last 3)   Recent Labs  06/18/16 1709 06/18/16 2119 06/19/16 0616  GLUCAP 95 118* 87    Assessment/Plan: S/P Procedure(s) (LRB): CORONARY ARTERY BYPASS GRAFTING (CABG)x4(LIMA to LAD, SVG to DIAGONAL, SVG to OM, and SVG to PDA)   WITH ENDOSCOPIC HARVESTING OF LEFT AND RIGHT SAPHENOUS VEINS. (N/A) TRANSESOPHAGEAL ECHOCARDIOGRAM (TEE) (N/A)  1. CV-sinus rhythm, rate 90s. BP well controlled.  2. Pulm-Tolerating room air. No CXR today.  3. Renal-creatinine 1.03. Electrolytes okay.  4. H and H stable.  5. Endo-blood glucose level well controlled.  6. Lovenox for DVT proph 7. GI- ++ bowel movement  Plan: Discontinue pacing wires. Ambulate TID and work with cardiac rehab. Encouraged incentive spirometry.    LOS: 10 days    Sharlene Dory 06/19/2016 Poss home 1-2 days Ambulating I have seen and examined [name] and agree with the above assessment  and plan.  [name] MD Beeper [phone] Office [phone] 06/19/2016 1:43 PM

## 2016-06-19 NOTE — Significant Event (Signed)
Patient transferred safely to 2W22, taken via wheelchair. Patient settled in bed. Personal belongings (black cellphone). Family made aware of the transfer. Report given to receiving RN Aggie Cosier.     [name]

## 2016-06-20 LAB — GLUCOSE, CAPILLARY
Glucose-Capillary: 92 mg/dL (ref 65–99)
Glucose-Capillary: 101 mg/dL — ABNORMAL HIGH (ref 65–99)

## 2016-06-20 MED ORDER — METOPROLOL TARTRATE 25 MG PO TABS: 12.5000 mg | ORAL_TABLET | Freq: Two times a day (BID) | ORAL | 1 refills | Status: AC

## 2016-06-20 MED ORDER — ASPIRIN 325 MG PO TBEC: 325.0000 mg | DELAYED_RELEASE_TABLET | Freq: Every day | ORAL | 0 refills | Status: AC

## 2016-06-20 MED ORDER — ATORVASTATIN CALCIUM 80 MG PO TABS: 80.0000 mg | ORAL_TABLET | Freq: Every day | ORAL | 1 refills | Status: AC

## 2016-06-20 MED ORDER — OXYCODONE HCL 10 MG PO TABS: 10.0000 mg | ORAL_TABLET | Freq: Four times a day (QID) | ORAL | 0 refills | Status: DC | PRN

## 2016-06-20 MED ORDER — INSULIN DETEMIR 100 UNIT/ML ~~LOC~~ SOLN
10.0000 [IU] | Freq: Every day | SUBCUTANEOUS | Status: DC
  Filled 2016-06-20: qty 0.1

## 2016-06-20 NOTE — Discharge Instructions (Signed)
Coronary Artery Bypass Grafting, Care After ° °This sheet gives you information about how to care for yourself after your procedure. Your health care provider may also give you more specific instructions. If you have problems or questions, contact your health care provider. °What can I expect after the procedure? °After the procedure, it is common to have: °· Nausea and a lack of appetite. °· Constipation. °· Weakness and fatigue. °· Depression or irritability. °· Pain or discomfort in your incision areas. °Follow these instructions at home: °Medicines  °· Take over-the-counter and prescription medicines only as told by your health care provider. Do not stop taking medicines or start any new medicines without approval from your health care provider. °· If you were prescribed an antibiotic medicine, take it as told by your health care provider. Do not stop taking the antibiotic even if you start to feel better. °· Do not drive or use heavy machinery while taking prescription pain medicine. °Incision care  °· Follow instructions from your health care provider about how to take care of your incisions. Make sure you: °¨ Wash your hands with soap and water before you change your bandage (dressing). If soap and water are not available, use hand sanitizer. °¨ Change your dressing as told by your health care provider. °¨ Leave stitches (sutures), skin glue, or adhesive strips in place. These skin closures may need to stay in place for 2 weeks or longer. If adhesive strip edges start to loosen and curl up, you may trim the loose edges. Do not remove adhesive strips completely unless your health care provider tells you to do that. °· Keep incision areas clean, dry, and protected. °· Check your incision areas every day for signs of infection. Check for: °¨ More redness, swelling, or pain. °¨ More fluid or blood. °¨ Warmth. °¨ Pus or a bad smell. °· If incisions were made in your legs: °¨ Avoid crossing your legs. °¨ Avoid  sitting for long periods of time. Change positions every 30 minutes. °¨ Raise (elevate) your legs when you are sitting. °Bathing  °· Do not take baths, swim, or use a hot tub until your health care provider approves. °· Only take sponge baths. Pat the incisions dry. Do not rub incisions with a washcloth or towel. °· Ask your health care provider when you can shower. °Eating and drinking  °· Eat foods that are high in fiber, such as raw fruits and vegetables, whole grains, beans, and nuts. Meats should be lean cut. Avoid canned, processed, and fried foods. This can help prevent constipation and is a recommended part of a heart-healthy diet. °· Drink enough fluid to keep your urine clear or pale yellow. °· Limit alcohol intake to no more than 1 drink a day for nonpregnant women and 2 drinks a day for men. One drink equals 12 oz of beer, 5 oz of wine, or 1½ oz of hard liquor. °Activity  °· Rest and limit your activity as told by your health care provider. You may be instructed to: °¨ Stop any activity right away if you have chest pain, shortness of breath, irregular heartbeats, or dizziness. Get help right away if you have any of these symptoms. °¨ Move around frequently for short periods or take short walks as directed by your health care provider. Gradually increase your activities. You may need physical therapy or cardiac rehabilitation to help strengthen your muscles and build your endurance. °¨ Avoid lifting, pushing, or pulling anything that is heavier than 10   lb (4.5 kg) for at least 6 weeks or as told by your health care provider.  Do not drive until your health care provider approves.  Ask your health care provider when you may return to work.  Ask your health care provider when you may resume sexual activity. General instructions   Do not use any products that contain nicotine or tobacco, such as cigarettes and e-cigarettes. If you need help quitting, ask your health care provider.  Take 2-3 deep  breaths every few hours during the day, while you recover. This helps expand your lungs and prevent complications like pneumonia after surgery.  If you were given a device called an incentive spirometer, use it several times a day to practice deep breathing. Support your chest with a pillow or your arms when you take deep breaths or cough.  Wear compression stockings as told by your health care provider. These stockings help to prevent blood clots and reduce swelling in your legs.  Weigh yourself every day. This helps identify if your body is holding (retaining) fluid that may make your heart and lungs work harder.  Keep all follow-up visits as told by your health care provider. This is important. Contact a health care provider if:  You have more redness, swelling, or pain around any incision.  You have more fluid or blood coming from any incision.  Any incision feels warm to the touch.  You have pus or a bad smell coming from any incision  You have a fever.  You have swelling in your ankles or legs.  You have pain in your legs.  You gain 2 lb (0.9 kg) or more a day.  You are nauseous or you vomit.  You have diarrhea. Get help right away if:  You have chest pain that spreads to your jaw or arms.  You are short of breath.  You have a fast or irregular heartbeat.  You notice a "clicking" in your breastbone (sternum) when you move.  You have numbness or weakness in your arms or legs.  You feel dizzy or light-headed. Summary  After the procedure, it is common to have pain or discomfort in the incision areas.  Do not take baths, swim, or use a hot tub until your health care provider approves.  Gradually increase your activities. You may need physical therapy or cardiac rehabilitation to help strengthen your muscles and build your endurance.  Weigh yourself every day. This helps identify if your body is holding (retaining) fluid that may make your heart and lungs work  harder. This information is not intended to replace advice given to you by your health care provider. Make sure you discuss any questions you have with your health care provider. Document Released: 09/15/2004 Document Revised: 01/16/2016 Document Reviewed: 01/16/2016 Elsevier Interactive Patient Education  2017 [employer] Inc.   Carbohydrate Counting for Diabetes Mellitus, Adult  Carbohydrate counting is a method for keeping track of how many carbohydrates you eat. Eating carbohydrates naturally increases the amount of sugar (glucose) in the blood. Counting how many carbohydrates you eat helps keep your blood glucose within normal limits, which helps you manage your diabetes (diabetes mellitus). It is important to know how many carbohydrates you can safely have in each meal. This is different for every person. A diet and nutrition specialist (registered dietitian) can help you make a meal plan and calculate how many carbohydrates you should have at each meal and snack. Carbohydrates are found in the following foods:  Grains, such as  breads and cereals.  Dried beans and soy products.  Starchy vegetables, such as potatoes, peas, and corn.  Fruit and fruit juices.  Milk and yogurt.  Sweets and snack foods, such as cake, cookies, candy, chips, and soft drinks. How do I count carbohydrates? There are two ways to count carbohydrates in food. You can use either of the methods or a combination of both. Reading "Nutrition Facts" on packaged food  The "Nutrition Facts" list is included on the labels of almost all packaged foods and beverages in the [place] It includes:  The serving size.  Information about nutrients in each serving, including the grams (g) of carbohydrate per serving. To use the Nutrition Facts":  Decide how many servings you will have.  Multiply the number of servings by the number of carbohydrates per serving.  The resulting number is the total amount of carbohydrates that  you will be having. Learning standard serving sizes of other foods  When you eat foods containing carbohydrates that are not packaged or do not include "Nutrition Facts" on the label, you need to measure the servings in order to count the amount of carbohydrates:  Measure the foods that you will eat with a food scale or measuring cup, if needed.  Decide how many standard-size servings you will eat.  Multiply the number of servings by 15. Most carbohydrate-rich foods have about 15 g of carbohydrates per serving.  For example, if you eat 8 oz (170 g) of strawberries, you will have eaten 2 servings and 30 g of carbohydrates (2 servings x 15 g = 30 g).  For foods that have more than one food mixed, such as soups and casseroles, you must count the carbohydrates in each food that is included. The following list contains standard serving sizes of common carbohydrate-rich foods. Each of these servings has about 15 g of carbohydrates:   hamburger bun or  English muffin.   oz (15 mL) syrup.   oz (14 g) jelly.  1 slice of bread.  1 six-inch tortilla.  3 oz (85 g) cooked rice or pasta.  4 oz (113 g) cooked dried beans.  4 oz (113 g) starchy vegetable, such as peas, corn, or potatoes.  4 oz (113 g) hot cereal.  4 oz (113 g) mashed potatoes or  of a large baked potato.  4 oz (113 g) canned or frozen fruit.  4 oz (120 mL) fruit juice.  4-6 crackers.  6 chicken nuggets.  6 oz (170 g) unsweetened dry cereal.  6 oz (170 g) plain fat-free yogurt or yogurt sweetened with artificial sweeteners.  8 oz (240 mL) milk.  8 oz (170 g) fresh fruit or one small piece of fruit.  24 oz (680 g) popped popcorn. Example of carbohydrate counting Sample meal   3 oz (85 g) chicken breast.  6 oz (170 g) brown rice.  4 oz (113 g) corn.  8 oz (240 mL) milk.  8 oz (170 g) strawberries with sugar-free whipped topping. Carbohydrate calculation  1. Identify the foods that contain  carbohydrates:  Rice.  Corn.  Milk.  Strawberries. 2. Calculate how many servings you have of each food:  2 servings rice.  1 serving corn.  1 serving milk.  1 serving strawberries. 3. Multiply each number of servings by 15 g:  2 servings rice x 15 g = 30 g.  1 serving corn x 15 g = 15 g.  1 serving milk x 15 g = 15 g.  1  serving strawberries x 15 g = 15 g. 4. Add together all of the amounts to find the total grams of carbohydrates eaten:  30 g + 15 g + 15 g + 15 g = 75 g of carbohydrates total.  This information is not intended to replace advice given to you by your health care provider. Make sure you discuss any questions you have with your health care provider. Document Released: 02/26/2005 Document Revised: 09/16/2015 Document Reviewed: 08/10/2015 Elsevier Interactive Patient Education  2017 [employer].

## 2016-06-20 NOTE — Progress Notes (Addendum)
      [address]       [place] [address]             [phone]      5 Days Post-Op Procedure(s) (LRB): CORONARY ARTERY BYPASS GRAFTING (CABG)x4(LIMA to LAD, SVG to DIAGONAL, SVG to OM, and SVG to PDA)   WITH ENDOSCOPIC HARVESTING OF LEFT AND RIGHT SAPHENOUS VEINS. (N/A) TRANSESOPHAGEAL ECHOCARDIOGRAM (TEE) (N/A) Subjective: No issues overnight. Patient shared that the bed is uncomfortable.  Objective: Vital signs in last 24 hours: Temp:  [97.7 F (36.5 C)-97.9 F (36.6 C)] 97.9 F (36.6 C) (04/11 0454) Pulse Rate:  [82-92] 90 (04/11 0454) Cardiac Rhythm: Normal sinus rhythm (04/10 2010) Resp:  [18-19] 18 (04/11 0454) BP: (105-133)/(64-86) 133/86 (04/11 0454) SpO2:  [98 %-100 %] 98 % (04/11 0454) Weight:  [127.5 kg (281 lb)] 127.5 kg (281 lb) (04/11 0457)     Intake/Output from previous day: 04/10 0701 - 04/11 0700 In: -  Out: 200 [Urine:200] Intake/Output this shift: No intake/output data recorded.  General appearance: alert, cooperative and no distress Heart: regular rate and rhythm, S1, S2 normal, no murmur, click, rub or gallop Lungs: clear to auscultation bilaterally Abdomen: soft, non-tender; bowel sounds normal; no masses,  no organomegaly Extremities: extremities normal, atraumatic, no cyanosis or edema Wound: clean and dry. Drainage appears to have dried up.    Lab Results:  Recent Labs  06/18/16 0400 06/19/16 0339  WBC 10.7* 9.2  HGB 9.4* 9.4*  HCT 29.6* 29.0*  PLT 142* 177   BMET:  Recent Labs  06/18/16 0400 06/19/16 0339  NA 137 141  K 3.6 4.0  CL 102 106  CO2 27 27  GLUCOSE 105* 105*  BUN 23* 19  CREATININE 1.17 1.03  CALCIUM 8.4* 8.6*    PT/INR: No results for input(s): LABPROT, INR in the last 72 hours. ABG    Component Value Date/Time   PHART 7.388 06/16/2016 0343   HCO3 23.3 06/16/2016 0343   TCO2 27 06/16/2016 1651   ACIDBASEDEF 2.0 06/16/2016 0343   O2SAT 98.0 06/16/2016 0343   CBG (last 3)    Recent Labs  06/19/16 1646 06/19/16 2021 06/20/16 0633  GLUCAP 89 107* 92    Assessment/Plan: S/P Procedure(s) (LRB): CORONARY ARTERY BYPASS GRAFTING (CABG)x4(LIMA to LAD, SVG to DIAGONAL, SVG to OM, and SVG to PDA)   WITH ENDOSCOPIC HARVESTING OF LEFT AND RIGHT SAPHENOUS VEINS. (N/A) TRANSESOPHAGEAL ECHOCARDIOGRAM (TEE) (N/A)  1. CV-sinus rhythm, rate 80s-90s. BP well controlled.  2. Pulm-Tolerating room air. No CXR today.  3. Renal-creatinine 1.03. Electrolytes okay.  4. H and H stable.  5. Endo-blood glucose level well controlled. Not a diabetic but requiring long-acting and short acting insulin. Decrease Levemir to 10 units. A1C is 6.2 6. Lovenox for DVT proph 7. GI- ++ bowel movement  Plan: Probably home today. Sternal drainage appears to have dried up. Spoke to the patient about his A1C. He does have a primary care appointment and he plans to make diet/exercise changes.     LOS: 11 days    Sharlene Dory 06/20/2016 Plan d/c today I have seen and examined [name] and agree with the above assessment  and plan.  [name] MD [facility] [phone] Office [phone] 06/20/2016 8:42 AM

## 2016-06-20 NOTE — Care Management Note (Signed)
Case Management Note Previous CM note initiated by Elliot Cousin, RN 06/13/2016, 3:31 PM   Patient Details  Name: [name] MRN: [id number] Date of Birth: [birth year]  Subjective/Objective:         CAD, CHF          Action/Plan: Discharge Planning: NCM spoke to pt and wife, [name] at bedside. Pt states he goes to [place]. Faxed H&P and progress note to [facility]. Scheduled surgery Friday, 4/6 for CABG. Will continue to follow for dc needs.    PCP Brian Chessman MD    Expected Discharge Date:  06/20/16               Expected Discharge Plan:  Home/Self Care  In-House Referral:  NA  Discharge planning Services  CM Consult  Post Acute Care Choice:  NA Choice offered to:  NA  DME Arranged:  N/A DME Agency:  NA  HH Arranged:  NA HH Agency:     Status of Service:  Completed, signed off  If discussed at Long Length of Stay Meetings, dates discussed:    Discharge Disposition: home/self care   Additional Comments:  06/20/16- 1030- Brian Lutes RN, CM- pt for d/c home today with wife-  Brian Span, RN 06/20/2016, 10:41 AM [phone]

## 2016-06-20 NOTE — Progress Notes (Signed)
CARDIAC REHAB PHASE I   PRE:  Rate/Rhythm: 91 SR  BP:  Sitting: 139/86        SaO2: 99 RA  MODE:  Ambulation: 350 ft   POST:  Rate/Rhythm: 88 SR  BP:  Sitting: 133/74         SaO2: 95 RA  Pt ambulated 350 ft on RA, independent, slow, steady gait, tolerated well. Pt c/o feeling "winded," denies any other complaints, declined rest stop. Cardiac surgery discharge education completed with pt and wife at bedside. Reviewed IS, sternal precautions, activity progression, exercise, heart healthy diet, carb counting, daily weights and phase 2 cardiac rehab. Pt and wife verbalized understanding, receptive to education. Pt agrees to phase 2 cardiac rehab referral, will send to [facility] per pt request. Pt to recliner after walk, call bell within reach.    [id number], [phone] Joylene Grapes, RN, BSN 06/20/2016 11:51 AM

## 2016-06-26 ENCOUNTER — Telehealth (HOSPITAL_COMMUNITY): Payer: Self-pay | Admitting: Physician Assistant

## 2016-06-26 NOTE — Telephone Encounter (Signed)
      [address]       [place] [address]             [phone]    [name] [id number]   S/P CABG x 4 on  performed on  06/15/2016 discharged home on 06/19/2016.  Medications: Current Outpatient Prescriptions on File Prior to Visit  Medication Sig Dispense Refill  . aspirin EC 325 MG EC tablet Take 1 tablet (325 mg total) by mouth daily. 30 tablet 0  . atorvastatin (LIPITOR) 80 MG tablet Take 1 tablet (80 mg total) by mouth daily at 6 PM. 30 tablet 1  . escitalopram (LEXAPRO) 10 MG tablet Take 10 mg by mouth daily.    . metoprolol tartrate (LOPRESSOR) 25 MG tablet Take 0.5 tablets (12.5 mg total) by mouth 2 (two) times daily. 30 tablet 1  . oxyCODONE 10 MG TABS Take 1 tablet (10 mg total) by mouth every 6 (six) hours as needed for severe pain. 30 tablet 0   No current facility-administered medications on file prior to visit.     Coumadin:  INR check Yes/No  Problems/Concerns: none.   Assessment:  Patient is doing well. He has been slowly increasing his activity level. He has lost an additional 17 lbs since his last weight in the hospital. He has been eating healthy and walking daily. He shares that he is sleeping better at night and off all pain medication. He is anxious to increase his activity level. He shares that he has some clear sternal drainage a few days ago but that has resolved. He knows about his follow-up appointment with [place] and knows to contact office if concerns or problems develop.   Follow up Appointment:   [name], MD Follow up on 07/16/2016.   Specialty:  Cardiothoracic Surgery Why:  Appointment is at 1:00, please get CXR at 12:30, at [facility] imaging located on first floor of our office building Contact information: [address] [place] [address] [phone]

## 2016-07-04 NOTE — Progress Notes (Deleted)
Cardiology Office Note    Date:  07/04/2016   ID:  [name], DOB [birth year], MRN [id number]  PCP:  [name]., MD  Cardiologist:  Dr. Eden Emms  CC: post hospital follow up   History of Present Illness:  [name] is a 61 y.o. male with a history of obesity, anxiety, HLD, pre-diabetesf and recently diagnosed CAD s/p CABGx4 who presents to clinic for post hospital follow up.   He was recently admitted from 3/31-4/10/18. He had no previous cardiac history. He presented with chest pain and found to have NSTEMI.  Cardiac catheterization was done and showed multivessel CAD with decreased EF of 45-50%. He was referred for surgical revascularization and underwent CABG x4V ( LIMA to LAD, SVG to Diagonal, SVG to OM, and SVG to PDA) on 06/15/16 by Dr. Tyrone Sage. His post operative course was unremarkable and he was dischraged home on 06/19/16.  Today he presents to clinic for follow up.   Past Medical History:  Diagnosis Date  . Anxiety   . Chest pain 06/09/2016  . Coronary artery disease 06/15/2016  . Coronary artery disease involving native heart without angina pectoris   . NSTEMI (non-ST elevated myocardial infarction) (HCC) 06/09/2016  . Obesity 06/09/2016  . 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 CORONARY ARTERY BYPASS GRAFT N/A 06/15/2016   Procedure: CORONARY ARTERY BYPASS GRAFTING (CABG)x4(LIMA to LAD, SVG to DIAGONAL, SVG to OM, and SVG to PDA)   WITH ENDOSCOPIC HARVESTING OF LEFT AND RIGHT SAPHENOUS VEINS.;  Surgeon: [name], MD;  Location: [facility] OR;  Service: Open Heart Surgery;  Laterality: N/A;  . LEFT HEART CATH AND CORONARY ANGIOGRAPHY N/A 06/11/2016   Procedure: Left Heart Cath and Coronary Angiography;  Surgeon: Yvonne Kendall, MD;  Location: [facility] INVASIVE CV LAB;  Service: Cardiovascular;  Laterality: N/A;  . TEE WITHOUT CARDIOVERSION N/A 06/15/2016   Procedure: TRANSESOPHAGEAL ECHOCARDIOGRAM (TEE);  Surgeon: [name], MD;  Location: [facility] OR;  Service: Open Heart Surgery;  Laterality: N/A;    Current Medications: Outpatient Medications Prior to Visit  Medication Sig Dispense Refill  . aspirin EC 325 MG EC tablet Take 1 tablet (325 mg total) by mouth daily. 30 tablet 0  . atorvastatin (LIPITOR) 80 MG tablet Take 1 tablet (80 mg total) by mouth daily at 6 PM. 30 tablet 1  . escitalopram (LEXAPRO) 10 MG tablet Take 10 mg by mouth daily.    . metoprolol tartrate (LOPRESSOR) 25 MG tablet Take 0.5 tablets (12.5 mg total) by mouth 2 (two) times daily. 30 tablet 1  . oxyCODONE 10 MG TABS Take 1 tablet (10 mg total) by mouth every 6 (six) hours as needed for severe pain. 30 tablet 0   No facility-administered medications prior to visit.      Allergies:   No known allergies   Social History   Social History  . Marital status: Single    Spouse name: N/A  . Number of children: N/A  . Years of education: N/A   Social History Main Topics  . Smoking status: Never Smoker  . Smokeless tobacco: Never Used  . Alcohol use No  . Drug use: No  . Sexual activity: Not on file   Other Topics Concern  . Not on file   Social History Narrative  . No narrative on file     Family History:  The patient's ***family history is not on file.      ***  ROS/PE    Wt Readings from Last 3 Encounters:  06/20/16 281 lb (127.5 kg)  06/23/12 255 lb (115.7 kg)      Studies/Labs Reviewed:   EKG:  EKG is*** ordered today.  The ekg ordered today demonstrates ***  Recent Labs: 06/09/2016: B Natriuretic Peptide 81.3 06/11/2016: TSH 1.764 06/14/2016: ALT 34 06/16/2016: Magnesium 2.2 06/19/2016: BUN 19; Creatinine, Ser 1.03; Hemoglobin 9.4; Platelets 177; Potassium 4.0; Sodium 141   Lipid Panel    Component Value Date/Time   CHOL 222 (H) 06/09/2016 0246   TRIG 273 (H) 06/09/2016 0246   HDL 41 06/09/2016 0246   CHOLHDL 5.4 06/09/2016 0246   VLDL 55 (H) 06/09/2016 0246   LDLCALC 126 (H) 06/09/2016 0246     Additional studies/ records that were reviewed today include:  2D ECHO: 06/09/2016 LV EF: 45% -   50% Study Conclusions - Left ventricle: The cavity size was normal. Wall thickness was   increased in a pattern of mild LVH. Systolic function was mildly   reduced. The estimated ejection fraction was in the range of 45%   to 50%. Diffuse hypokinesis. Although no diagnostic regional wall   motion abnormality was identified, this possibility cannot be   completely excluded on the basis of this study. Doppler   parameters are consistent with abnormal left ventricular   relaxation (grade 1 diastolic dysfunction). - Left atrium: The atrium was mildly dilated. Impressions: - Even with Definity contrast, wall motion and EF assessment are   difficult due to body habitus. Consider corroboration with   alternative imaging techniques.  06/11/16 Left Heart Cath and Coronary Angiography   Conclusions: 1. 3-vessel coronary artery disease, including 80% ostial and 90% mid LAD lesions. The mid LAD stenosis is a complex lesion involving ostium of a moderate-caliber first diagonal ([place] 1,1,1). 60% moderate-caliber OM1 as well as 60-70% proximal and 40% mid RCA lesions are also present. 2. Mildly reduced left ventricular contraction with apical anterior and apical hypokinesis. 3. Moderately elevated left ventricular filling pressure.  Recommendations: 1. Cardiac surgery consultation. Though RCA and OM disease is borderline, ostial through mid LAD disease is severe and involves a sizable diagonal branch that would be challenging to treat percutaneously. 2. Medical optimization, including discontinuation of ticagrelor pending surgical consultation. 3. Restart heparin infusion 2 hours after TR band removal. 4. Gentle diuresis, given elevated LVEDP and mildly reduced LVEF.    Carotid artery dopplers 06/14/16 Summary: - Findings are consistent with a 1-39 percent stenosis involving   the right  internal carotid artery and the left internal carotid   artery. The vertebral arteries demonstrate antegrade flow. - Right ABI of 1.19 and left ABI of 1.21 are suggestive of arterial   flow within normal limits at rest.   ASSESSMENT & PLAN:   CAD s/p CABG:  Obesity:  Pre-diabetes: HgA1c 6.2  HLD:   Medication Adjustments/Labs and Tests Ordered: Current medicines are reviewed at length with the patient today.  Concerns regarding medicines are outlined above.  Medication changes, Labs and Tests ordered today are listed in the Patient Instructions below. There are no Patient Instructions on file for this visit.   Signed, Cline Crock, PA-C  07/04/2016 8:49 PM    [facility] [address] [place], [place], [place]  [address] Phone: [phone]; Fax: [phone]

## 2016-07-05 ENCOUNTER — Encounter: Payer: Self-pay | Admitting: Physician Assistant

## 2016-07-06 ENCOUNTER — Encounter: Payer: Self-pay | Admitting: Physician Assistant

## 2016-07-13 ENCOUNTER — Other Ambulatory Visit: Payer: Self-pay | Admitting: Cardiothoracic Surgery

## 2016-07-13 DIAGNOSIS — Z951 Presence of aortocoronary bypass graft: Secondary | ICD-10-CM

## 2016-07-16 ENCOUNTER — Ambulatory Visit
Admission: RE | Admit: 2016-07-16 | Discharge: 2016-07-16 | Disposition: A | Discharge status: Home / Self Care | Payer: Non-veteran care | Source: Ambulatory Visit | Attending: Cardiothoracic Surgery | Admitting: Cardiothoracic Surgery

## 2016-07-16 ENCOUNTER — Ambulatory Visit (INDEPENDENT_AMBULATORY_CARE_PROVIDER_SITE_OTHER): Payer: Self-pay | Admitting: Surgical

## 2016-07-16 VITALS — BP 112/77 | HR 87 | Resp 16 | Ht 75.0 in | Wt 263.0 lb

## 2016-07-16 DIAGNOSIS — Z951 Presence of aortocoronary bypass graft: Secondary | ICD-10-CM

## 2016-07-16 DIAGNOSIS — I214 Non-ST elevation (NSTEMI) myocardial infarction: Secondary | ICD-10-CM

## 2016-07-16 DIAGNOSIS — I2510 Atherosclerotic heart disease of native coronary artery without angina pectoris: Secondary | ICD-10-CM

## 2016-07-16 NOTE — Patient Instructions (Signed)
Postoperative activity progression including driving and lifting instructions discussed with the patient and he understands.

## 2016-07-16 NOTE — Progress Notes (Signed)
[address]       [place] [address]             [phone]                  [facility] Medical Record #[id number] Date of Birth: 07/12/1955  Referring MD:End, Cristal Deer, MD Primary Cardiology: Primary Care:Aguiar, Genia Del, MD  Chief Complaint:  Follow Up Visit   DATE OF PROCEDURE:  06/15/2016 DATE OF DISCHARGE:  PREOPERATIVE DIAGNOSIS:  Recent admission for non-ST elevation myocardial infarction.  POSTOPERATIVE DIAGNOSIS:  Recent admission for non-ST elevation myocardial infarction.  SURGICAL PROCEDURE:  Coronary artery bypass grafting x4 with the left internal mammary to the left anterior descending coronary artery, reverse saphenous vein graft to the diagonal coronary artery, reverse saphenous vein graft to the obtuse marginal coronary artery, and reverse saphenous vein graft to the posterior descending coronary artery with left leg endo vein harvesting of the greater saphenous vein and calf and right thigh greater saphenous vein endo vein harvesting.  SURGEON:  Sheliah Plane, MD.  FIRST ASSISTANT:  Doree Fudge, PA.  SECOND ASSISTANT:  Jari Favre, [place].  History of Present Illness:    The patient is a 61 year old gentleman status post the above described procedure seeing in the office on today's date in routine postoperative follow-up. He reports that he has continued to make excellent progress. He hasn't taken any pain medication including Tylenol, and feels like he never had any significant need. He is increasing his activities daily and tolerating them quite well. He has not had any fevers, chills or other constitutional symptoms and his incisions are healing well. He did miss his cardiology appointment but has 1 scheduled at the [facility] on June 13.         Zubrod Score: At the time of surgery this patient's most appropriate activity status/level should be described as: []     0    Normal activity, no  symptoms []     1    Restricted in physical strenuous activity but ambulatory, able to do out light work []     2    Ambulatory and capable of self care, unable to do work activities, up and about                 >50 % of waking hours                                                                                   []     3    Only limited self care, in bed greater than 50% of waking hours []     4    Completely disabled, no self care, confined to bed or chair []     5    Moribund  History  Smoking Status  . Never Smoker  Smokeless Tobacco  . Never Used       Allergies  Allergen Reactions  . No Known Allergies     Current Outpatient Prescriptions  Medication Sig Dispense Refill  . aspirin EC 325 MG EC tablet Take 1 tablet (325 mg total) by mouth daily. 30 tablet 0  .  atorvastatin (LIPITOR) 80 MG tablet Take 1 tablet (80 mg total) by mouth daily at 6 PM. 30 tablet 1  . escitalopram (LEXAPRO) 10 MG tablet Take 10 mg by mouth daily.    . metoprolol tartrate (LOPRESSOR) 25 MG tablet Take 0.5 tablets (12.5 mg total) by mouth 2 (two) times daily. 30 tablet 1   No current facility-administered medications for this visit.        Physical Exam: BP 112/77 (BP Location: Right Arm, Patient Position: Sitting, Cuff Size: Large)   Pulse 87   Resp 16   Ht 6\' 3"  (1.905 m)   Wt 263 lb (119.3 kg)   SpO2 97% Comment: ON RA  BMI 32.87 kg/m   General appearance: alert, cooperative and no distress Heart: regular rate and rhythm Lungs: clear to auscultation bilaterally Abdomen: soft, non-tender; bowel sounds normal; no masses,  no organomegaly Extremities: trace edema Wounds: Incisions all healing well without evidence of infection.  Diagnostic Studies & Laboratory data:         Recent Radiology Findings: Dg Chest 2 View  Result Date: 07/16/2016 CLINICAL DATA:  Status post CABG 1 month ago. EXAM: CHEST  2 VIEW COMPARISON:  06/18/2016 FINDINGS: Status post median sternotomy and CABG  procedure. The heart appears mildly enlarged. There is no pleural effusion or edema. No airspace opacities. IMPRESSION: No complications status post CABG procedure. Electronically Signed   By: Signa Kellaylor  [place] M.D.   On: 07/16/2016 12:58      I have independently reviewed the above radiology findings and reviewed findings  with the patient.  Recent Labs: Lab Results  Component Value Date   WBC 9.2 06/19/2016   HGB 9.4 (L) 06/19/2016   HCT 29.0 (L) 06/19/2016   PLT 177 06/19/2016   GLUCOSE 105 (H) 06/19/2016   CHOL 222 (H) 06/09/2016   TRIG 273 (H) 06/09/2016   HDL 41 06/09/2016   LDLCALC 126 (H) 06/09/2016   ALT 34 06/14/2016   AST 26 06/14/2016   NA 141 06/19/2016   K 4.0 06/19/2016   CL 106 06/19/2016   CREATININE 1.03 06/19/2016   BUN 19 06/19/2016   CO2 27 06/19/2016   TSH 1.764 06/11/2016   INR 1.19 06/15/2016   HGBA1C 6.2 (H) 06/09/2016      Assessment / Plan:  The patient has continued to make excellent progress. There are no current surgically related issues. We discussed activity progression including driving. 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 He has a 0 turn lawnmower so there is not significant stress on the sternum. [facility] We will see him again on a when necessary basis for any surgically related issues or at request.        GOLD,WAYNE E 07/16/2016 1:34 PM

## 2016-07-18 ENCOUNTER — Encounter: Payer: Self-pay | Admitting: Physician Assistant

## 2016-11-01 NOTE — Addendum Note (Signed)
Addendum  created 11/01/16 1242 by Ginny Loomer, MD   Sign clinical note    

## 2017-08-30 DIAGNOSIS — D72829 Elevated white blood cell count, unspecified: Secondary | ICD-10-CM

## 2017-08-30 DIAGNOSIS — K859 Acute pancreatitis without necrosis or infection, unspecified: Secondary | ICD-10-CM

## 2017-08-30 DIAGNOSIS — R109 Unspecified abdominal pain: Secondary | ICD-10-CM | POA: Diagnosis not present

## 2017-08-30 DIAGNOSIS — R739 Hyperglycemia, unspecified: Secondary | ICD-10-CM | POA: Diagnosis not present

## 2017-08-31 DIAGNOSIS — R739 Hyperglycemia, unspecified: Secondary | ICD-10-CM | POA: Diagnosis not present

## 2017-08-31 DIAGNOSIS — R109 Unspecified abdominal pain: Secondary | ICD-10-CM | POA: Diagnosis not present

## 2017-08-31 DIAGNOSIS — D72829 Elevated white blood cell count, unspecified: Secondary | ICD-10-CM | POA: Diagnosis not present

## 2017-08-31 DIAGNOSIS — K859 Acute pancreatitis without necrosis or infection, unspecified: Secondary | ICD-10-CM | POA: Diagnosis not present

## 2017-09-01 DIAGNOSIS — R739 Hyperglycemia, unspecified: Secondary | ICD-10-CM | POA: Diagnosis not present

## 2017-09-01 DIAGNOSIS — K859 Acute pancreatitis without necrosis or infection, unspecified: Secondary | ICD-10-CM | POA: Diagnosis not present

## 2017-09-01 DIAGNOSIS — D72829 Elevated white blood cell count, unspecified: Secondary | ICD-10-CM | POA: Diagnosis not present

## 2017-09-01 DIAGNOSIS — I517 Cardiomegaly: Secondary | ICD-10-CM

## 2017-09-01 DIAGNOSIS — R109 Unspecified abdominal pain: Secondary | ICD-10-CM

## 2017-09-02 DIAGNOSIS — R739 Hyperglycemia, unspecified: Secondary | ICD-10-CM | POA: Diagnosis not present

## 2017-09-02 DIAGNOSIS — D72829 Elevated white blood cell count, unspecified: Secondary | ICD-10-CM | POA: Diagnosis not present

## 2017-09-02 DIAGNOSIS — R109 Unspecified abdominal pain: Secondary | ICD-10-CM | POA: Diagnosis not present

## 2017-09-02 DIAGNOSIS — K859 Acute pancreatitis without necrosis or infection, unspecified: Secondary | ICD-10-CM | POA: Diagnosis not present

## 2017-09-03 DIAGNOSIS — R109 Unspecified abdominal pain: Secondary | ICD-10-CM | POA: Diagnosis not present

## 2017-09-03 DIAGNOSIS — K859 Acute pancreatitis without necrosis or infection, unspecified: Secondary | ICD-10-CM | POA: Diagnosis not present

## 2017-09-03 DIAGNOSIS — D72829 Elevated white blood cell count, unspecified: Secondary | ICD-10-CM | POA: Diagnosis not present

## 2017-09-03 DIAGNOSIS — R739 Hyperglycemia, unspecified: Secondary | ICD-10-CM | POA: Diagnosis not present

## 2017-09-04 DIAGNOSIS — R109 Unspecified abdominal pain: Secondary | ICD-10-CM | POA: Diagnosis not present

## 2017-09-04 DIAGNOSIS — R739 Hyperglycemia, unspecified: Secondary | ICD-10-CM | POA: Diagnosis not present

## 2017-09-04 DIAGNOSIS — K859 Acute pancreatitis without necrosis or infection, unspecified: Secondary | ICD-10-CM | POA: Diagnosis not present

## 2017-09-04 DIAGNOSIS — D72829 Elevated white blood cell count, unspecified: Secondary | ICD-10-CM | POA: Diagnosis not present

## 2017-09-05 DIAGNOSIS — R739 Hyperglycemia, unspecified: Secondary | ICD-10-CM | POA: Diagnosis not present

## 2017-09-05 DIAGNOSIS — D72829 Elevated white blood cell count, unspecified: Secondary | ICD-10-CM | POA: Diagnosis not present

## 2017-09-05 DIAGNOSIS — K859 Acute pancreatitis without necrosis or infection, unspecified: Secondary | ICD-10-CM | POA: Diagnosis not present

## 2017-09-05 DIAGNOSIS — R109 Unspecified abdominal pain: Secondary | ICD-10-CM | POA: Diagnosis not present

## 2018-09-03 IMAGING — CR DG CHEST 1V PORT
1 series · 1 of 1 positions shown · non-contrast
Comparison: Portable chest x-ray 17 June, 2016

CLINICAL DATA: Status post CABG on 15 June, 2016

EXAM:
PORTABLE CHEST 1 VIEW

[AP]
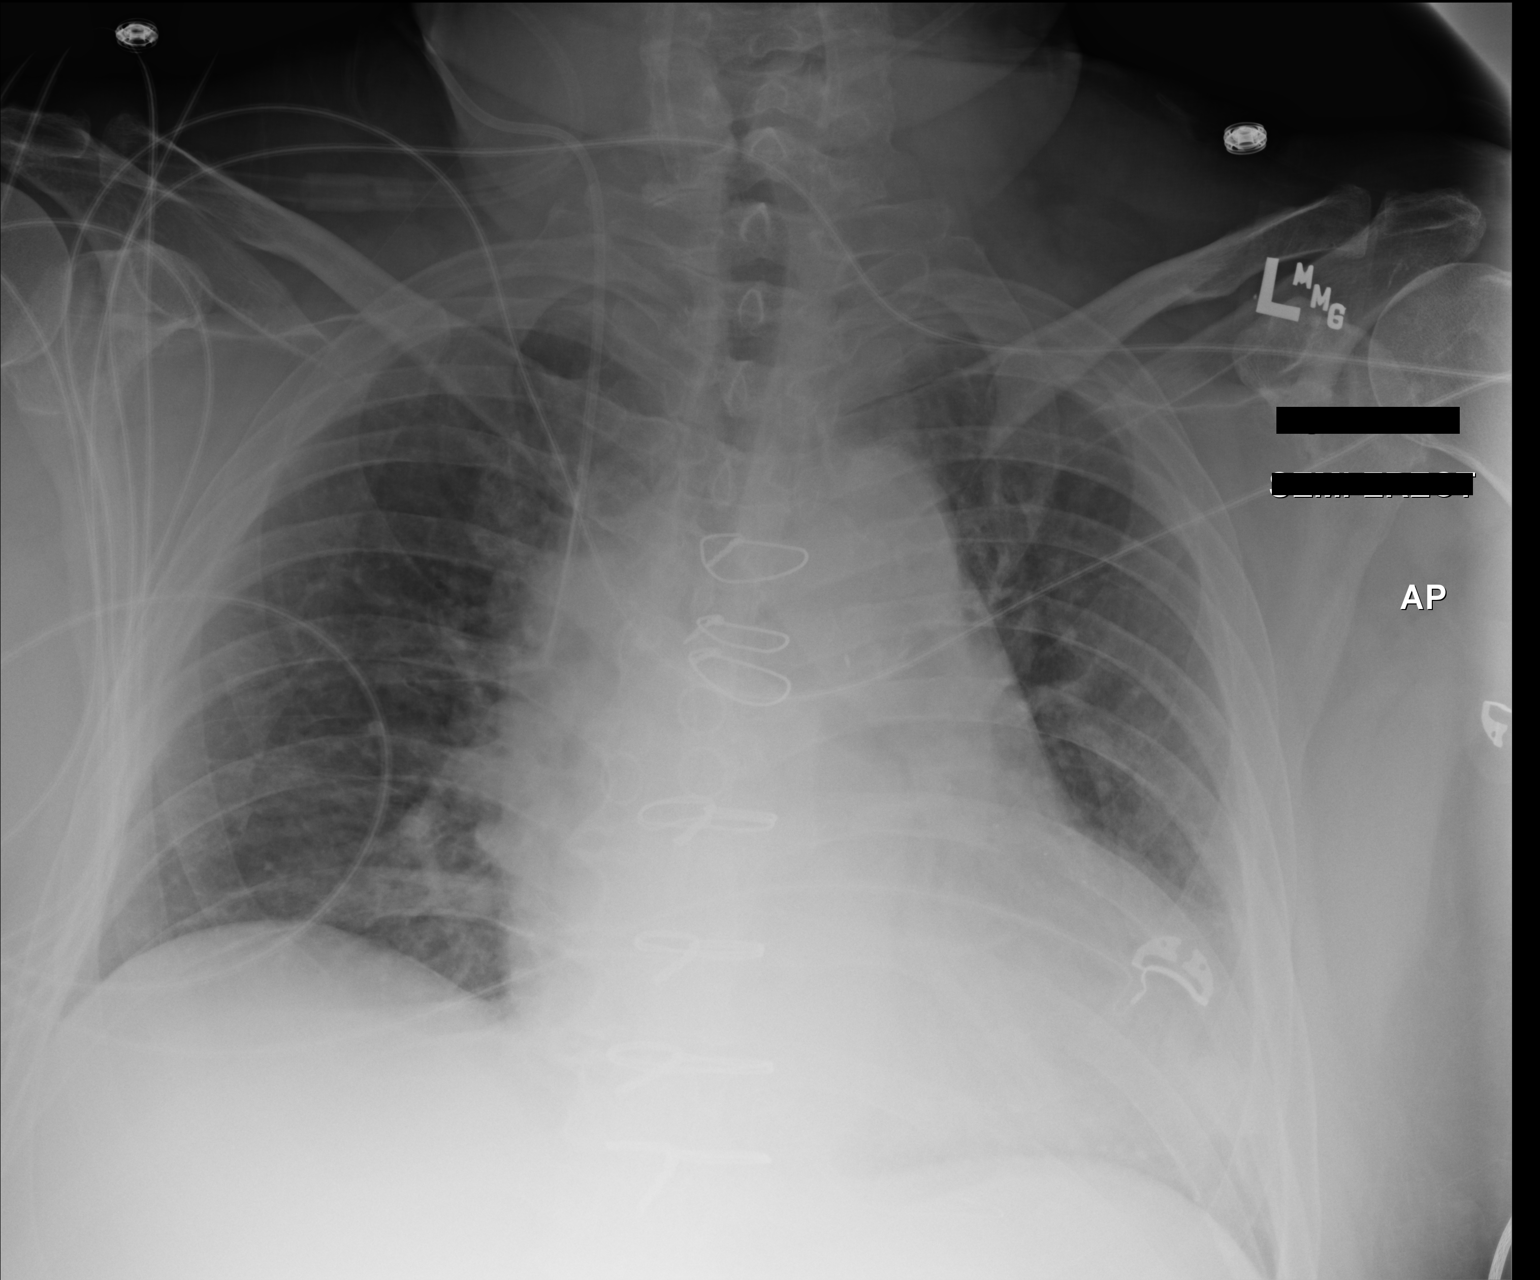

[1 of 1 positions shown; findings below may reference images not displayed]

FINDINGS: The lungs are better inflated today. The interstitial markings are
less prominent. Interval improvement in left lower lobe atelectasis.
No pleural effusion or pneumothorax. The cardiac silhouette remains
enlarged. The right internal jugular venous catheter tip projects
over the mid SVC.
IMPRESSION: Further interval improvement in the appearance of the pulmonary
interstitium. Decreased left lower lobe atelectasis. Stable
cardiomegaly without pulmonary vascular congestion.

## 2018-10-01 IMAGING — CR DG CHEST 2V
2 series · 2 of 2 positions shown · non-contrast
Comparison: 06/18/2016

CLINICAL DATA: Status post CABG 1 month ago.

EXAM:
CHEST  2 VIEW

[w chest pa]
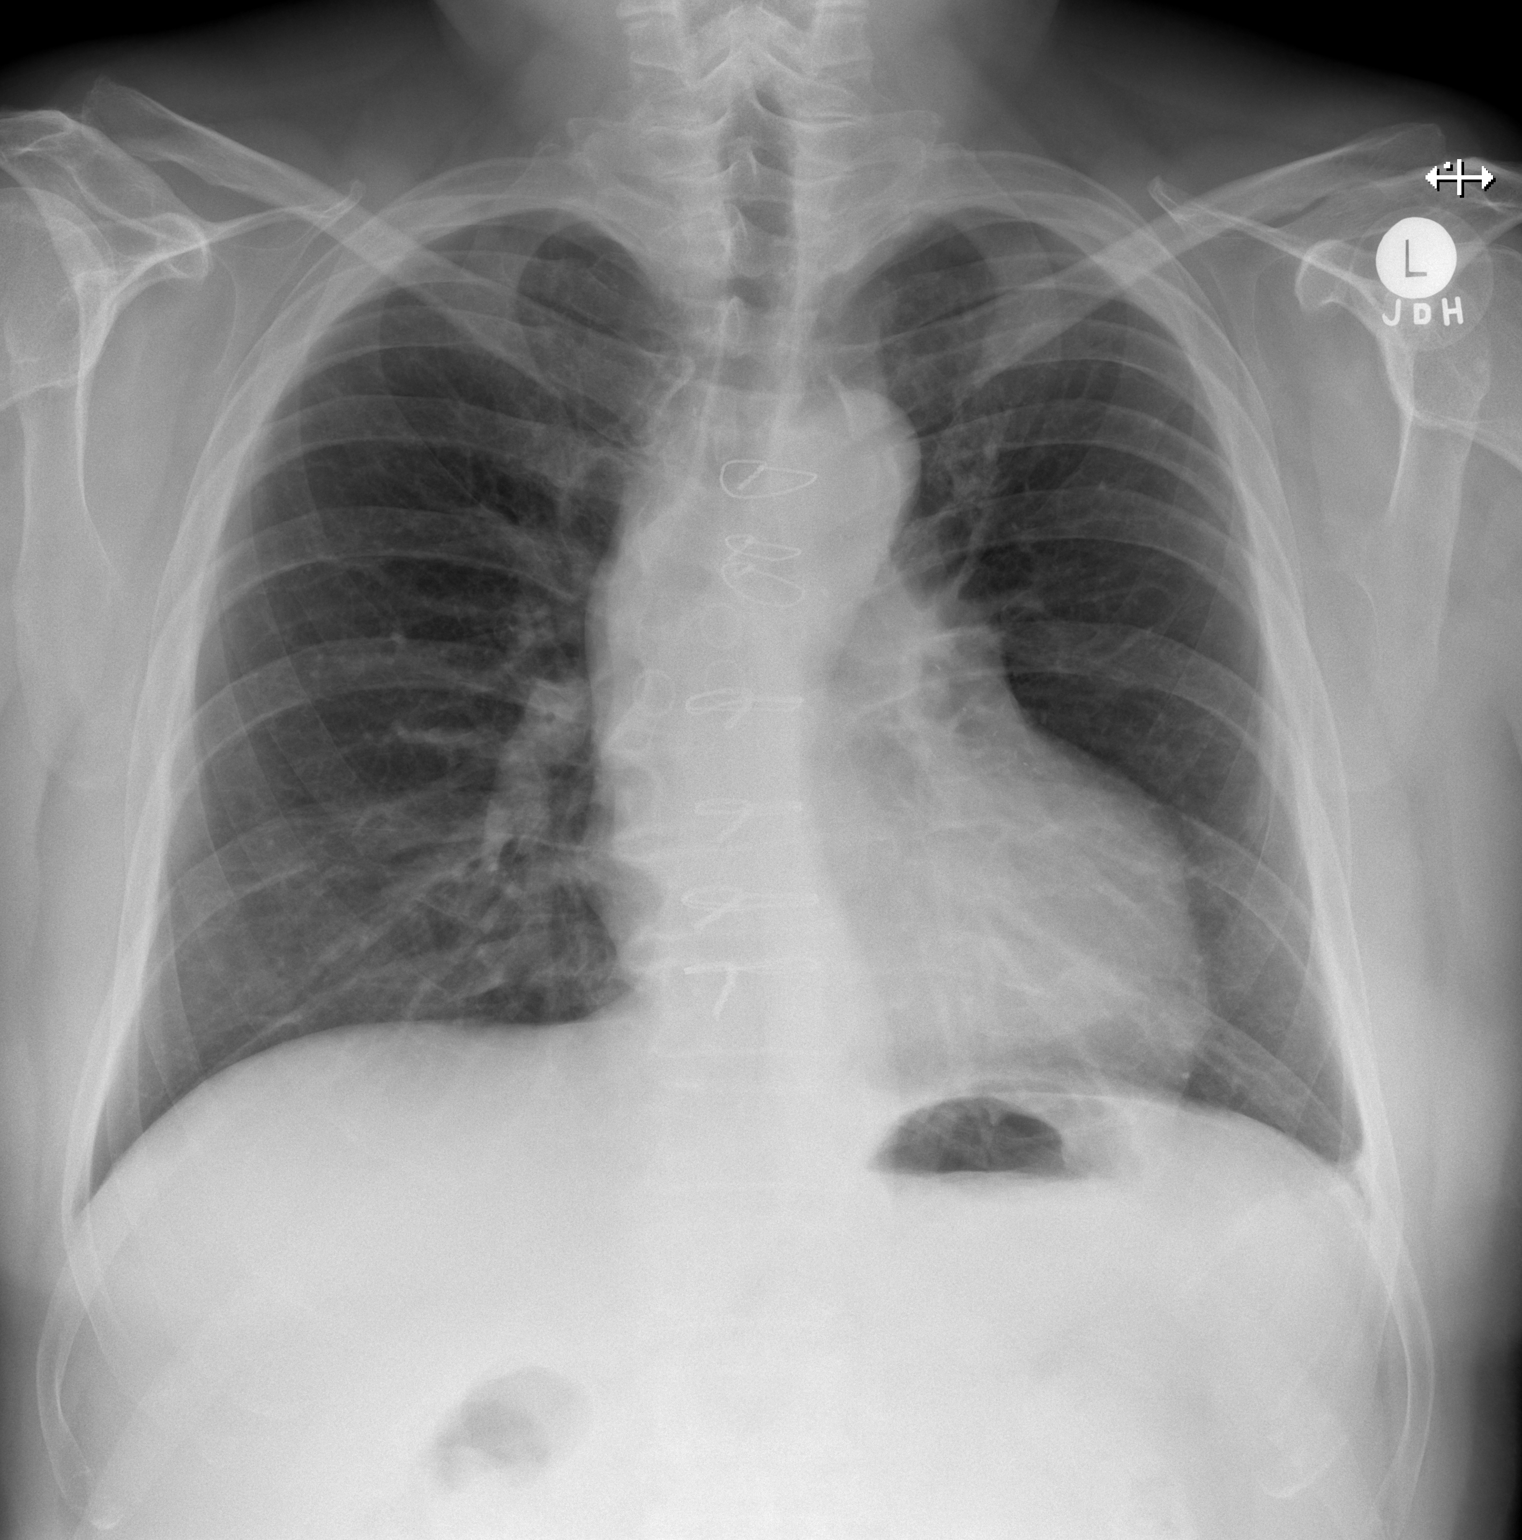

[w chest lat]
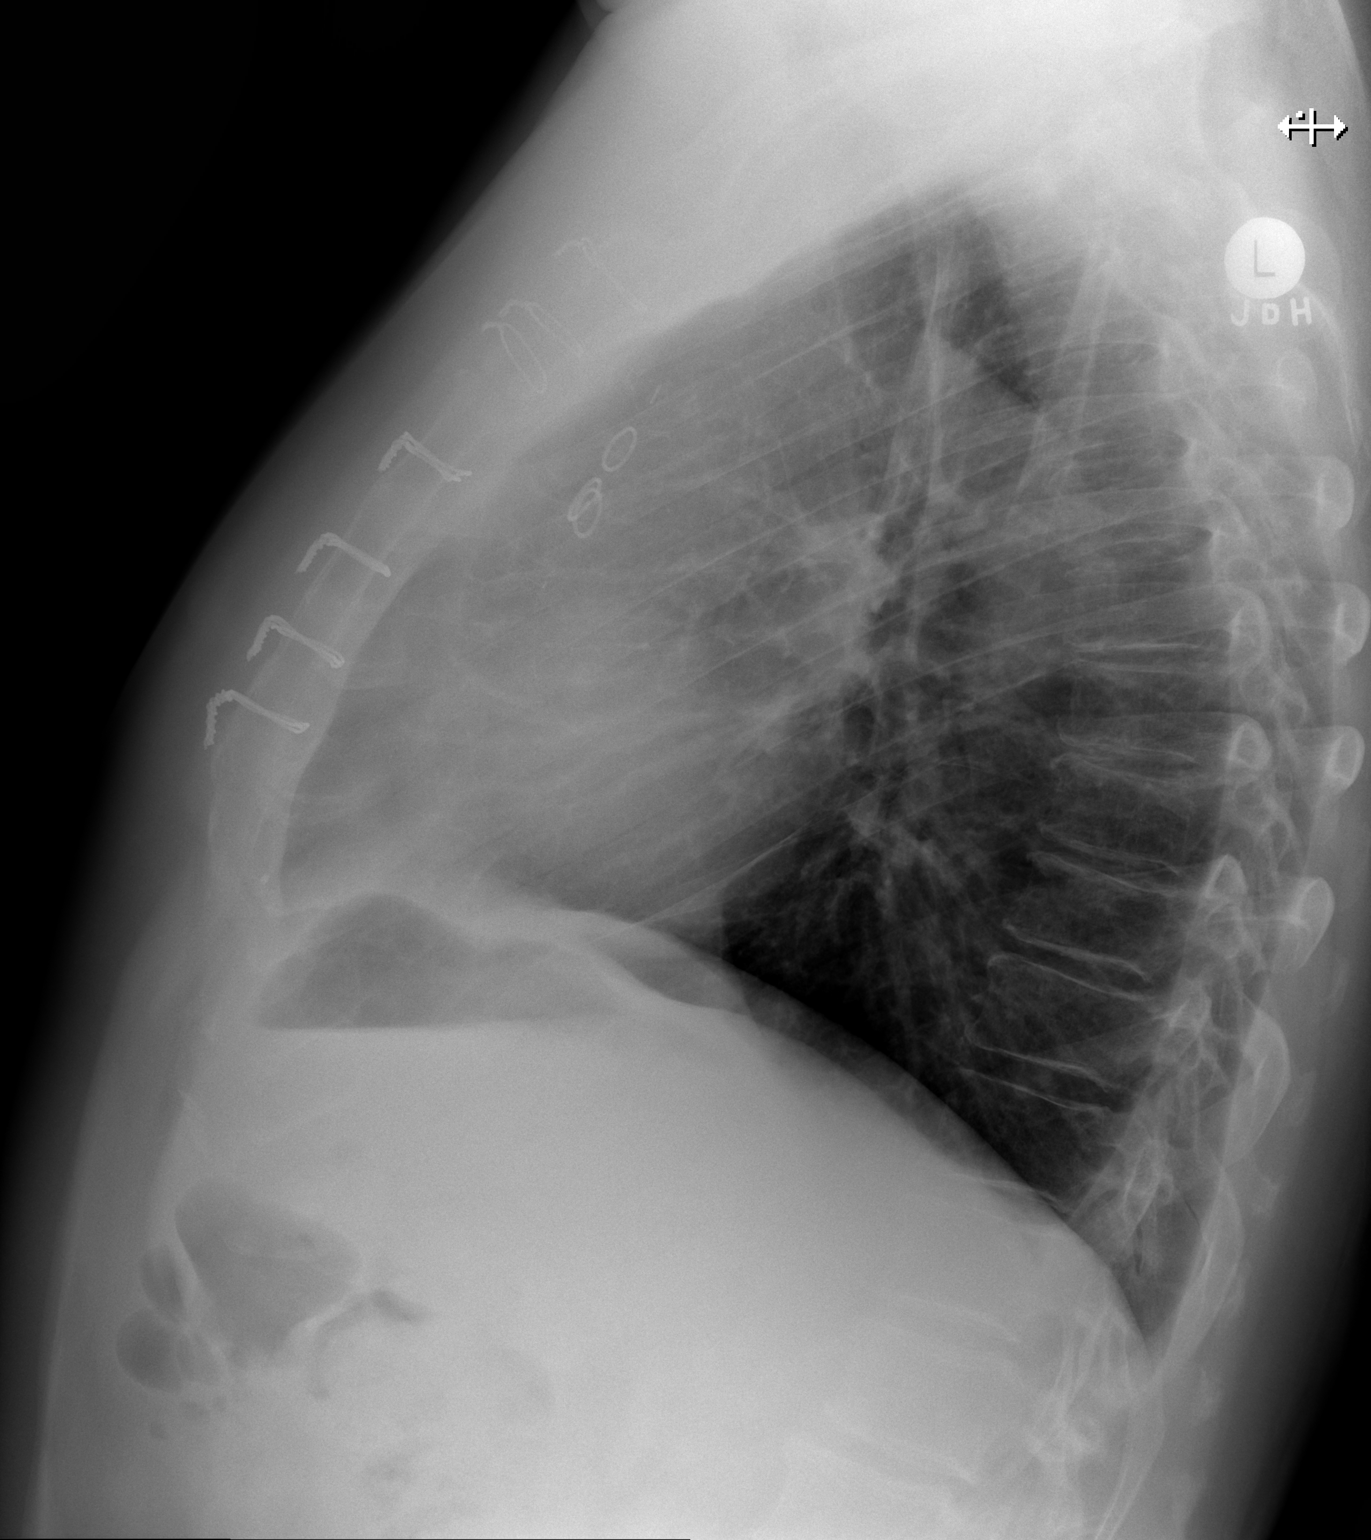

[2 of 2 positions shown; findings below may reference images not displayed]

FINDINGS: Status post median sternotomy and CABG procedure. The heart appears
mildly enlarged. There is no pleural effusion or edema. No airspace
opacities.
IMPRESSION: No complications status post CABG procedure.
